# Patient Record
Sex: Female | Born: 1960 | Race: Black or African American | Hispanic: No | Marital: Single | Smoking: Current every day smoker
Health system: Southern US, Community
[De-identification: ages and names within clinical notes are randomized; demographics above are authoritative.]

## PROBLEM LIST (undated history)

## (undated) ENCOUNTER — Emergency Department (HOSPITAL_COMMUNITY): Disposition: A | Discharge status: Home / Self Care | Payer: Self-pay

## (undated) DIAGNOSIS — E785 Hyperlipidemia, unspecified: Secondary | ICD-10-CM

## (undated) DIAGNOSIS — I10 Essential (primary) hypertension: Secondary | ICD-10-CM

---

## 2008-03-26 ENCOUNTER — Emergency Department (HOSPITAL_COMMUNITY): Admission: EM | Admit: 2008-03-26 | Discharge: 2008-03-26 | Payer: Self-pay | Admitting: Emergency Medicine

## 2008-04-01 ENCOUNTER — Encounter (INDEPENDENT_AMBULATORY_CARE_PROVIDER_SITE_OTHER): Payer: Self-pay | Admitting: Adult Health

## 2008-04-01 ENCOUNTER — Ambulatory Visit: Payer: Self-pay | Admitting: Internal Medicine

## 2008-04-01 LAB — CONVERTED CEMR LAB
AST: 27 units/L (ref 0–37)
Albumin: 4.2 g/dL (ref 3.5–5.2)
BUN: 11 mg/dL (ref 6–23)
CO2: 20 meq/L (ref 19–32)
Chloride: 96 meq/L (ref 96–112)
Cholesterol: 266 mg/dL — ABNORMAL HIGH (ref 0–200)
Glucose, Bld: 464 mg/dL — ABNORMAL HIGH (ref 70–99)
HDL: 42 mg/dL (ref >39)
LDL Cholesterol: 172 mg/dL — ABNORMAL HIGH (ref 0–99)
Microalb, Ur: 0.63 mg/dL (ref 0.00–1.89)
Potassium: 4.3 meq/L (ref 3.5–5.3)
Sodium: 134 meq/L — ABNORMAL LOW (ref 135–145)
Triglycerides: 261 mg/dL — ABNORMAL HIGH (ref <150)
VLDL: 52 mg/dL — ABNORMAL HIGH (ref 0–40)

## 2008-04-02 ENCOUNTER — Ambulatory Visit: Payer: Self-pay | Admitting: Internal Medicine

## 2008-04-05 ENCOUNTER — Ambulatory Visit: Payer: Self-pay | Admitting: Internal Medicine

## 2008-04-05 ENCOUNTER — Encounter (INDEPENDENT_AMBULATORY_CARE_PROVIDER_SITE_OTHER): Payer: Self-pay | Admitting: Adult Health

## 2008-04-25 ENCOUNTER — Ambulatory Visit: Payer: Self-pay | Admitting: *Deleted

## 2008-05-06 ENCOUNTER — Ambulatory Visit: Payer: Self-pay | Admitting: Internal Medicine

## 2008-05-08 ENCOUNTER — Ambulatory Visit (HOSPITAL_COMMUNITY): Admission: RE | Admit: 2008-05-08 | Discharge: 2008-05-08 | Payer: Self-pay | Admitting: Internal Medicine

## 2008-05-14 ENCOUNTER — Ambulatory Visit: Payer: Self-pay | Admitting: Internal Medicine

## 2008-05-22 ENCOUNTER — Emergency Department (HOSPITAL_COMMUNITY): Admission: EM | Admit: 2008-05-22 | Discharge: 2008-05-22 | Payer: Self-pay | Admitting: Family Medicine

## 2008-06-03 ENCOUNTER — Ambulatory Visit: Payer: Self-pay | Admitting: Internal Medicine

## 2008-07-22 ENCOUNTER — Ambulatory Visit: Payer: Self-pay | Admitting: Internal Medicine

## 2008-07-25 ENCOUNTER — Ambulatory Visit: Payer: Self-pay | Admitting: Internal Medicine

## 2008-07-29 ENCOUNTER — Ambulatory Visit: Payer: Self-pay | Admitting: Internal Medicine

## 2008-07-30 ENCOUNTER — Emergency Department (HOSPITAL_COMMUNITY): Admission: EM | Admit: 2008-07-30 | Discharge: 2008-07-30 | Payer: Self-pay | Admitting: Emergency Medicine

## 2008-08-14 ENCOUNTER — Encounter: Payer: Self-pay | Admitting: Obstetrics and Gynecology

## 2008-08-14 ENCOUNTER — Ambulatory Visit: Payer: Self-pay | Admitting: Obstetrics & Gynecology

## 2008-08-14 LAB — CONVERTED CEMR LAB: GC Probe Amp, Urine: NEGATIVE

## 2008-09-26 ENCOUNTER — Ambulatory Visit: Payer: Self-pay | Admitting: Internal Medicine

## 2008-09-26 ENCOUNTER — Emergency Department (HOSPITAL_COMMUNITY): Admission: EM | Admit: 2008-09-26 | Discharge: 2008-09-26 | Payer: Self-pay | Admitting: Emergency Medicine

## 2008-09-27 ENCOUNTER — Encounter (INDEPENDENT_AMBULATORY_CARE_PROVIDER_SITE_OTHER): Payer: Self-pay | Admitting: Adult Health

## 2009-04-01 ENCOUNTER — Encounter (INDEPENDENT_AMBULATORY_CARE_PROVIDER_SITE_OTHER): Payer: Self-pay | Admitting: Adult Health

## 2009-04-01 ENCOUNTER — Ambulatory Visit: Payer: Self-pay | Admitting: Internal Medicine

## 2009-06-30 ENCOUNTER — Ambulatory Visit: Payer: Self-pay | Admitting: Family Medicine

## 2009-08-29 ENCOUNTER — Emergency Department (HOSPITAL_COMMUNITY): Admission: EM | Admit: 2009-08-29 | Discharge: 2009-08-29 | Payer: Self-pay | Admitting: Emergency Medicine

## 2009-11-04 ENCOUNTER — Encounter (INDEPENDENT_AMBULATORY_CARE_PROVIDER_SITE_OTHER): Payer: Self-pay | Admitting: Internal Medicine

## 2009-11-04 LAB — CONVERTED CEMR LAB
ALT: 14 units/L (ref 0–35)
Alkaline Phosphatase: 103 units/L (ref 39–117)
Basophils Absolute: 0.1 10*3/uL (ref 0.0–0.1)
Chloride: 106 meq/L (ref 96–112)
Eosinophils Relative: 1 % (ref 0–5)
Glucose, Bld: 144 mg/dL — ABNORMAL HIGH (ref 70–99)
HCT: 41 % (ref 36.0–46.0)
Hemoglobin: 13.5 g/dL (ref 12.0–15.0)
Hgb A1c MFr Bld: 14.1 % — ABNORMAL HIGH (ref <5.7)
Lipase: 58 units/L (ref 0–75)
MCHC: 32.9 g/dL (ref 30.0–36.0)
MCV: 84.9 fL (ref 78.0–100.0)
Monocytes Relative: 5 % (ref 3–12)
Neutro Abs: 4.3 10*3/uL (ref 1.7–7.7)
Neutrophils Relative %: 45 % (ref 43–77)
Platelets: 272 10*3/uL (ref 150–400)
Sodium: 141 meq/L (ref 135–145)

## 2010-01-29 ENCOUNTER — Emergency Department (HOSPITAL_COMMUNITY)
Admission: EM | Admit: 2010-01-29 | Discharge: 2010-01-29 | Payer: Self-pay | Source: Home / Self Care | Admitting: Emergency Medicine

## 2010-02-26 ENCOUNTER — Emergency Department (HOSPITAL_COMMUNITY)
Admission: EM | Admit: 2010-02-26 | Discharge: 2010-02-26 | Payer: Self-pay | Source: Home / Self Care | Admitting: Emergency Medicine

## 2010-03-01 ENCOUNTER — Encounter: Payer: Self-pay | Admitting: Internal Medicine

## 2010-03-02 ENCOUNTER — Encounter: Payer: Self-pay | Admitting: *Deleted

## 2010-03-02 LAB — COMPREHENSIVE METABOLIC PANEL
ALT: 16 U/L (ref 0–35)
AST: 15 U/L (ref 0–37)
Albumin: 3.9 g/dL (ref 3.5–5.2)
Alkaline Phosphatase: 111 U/L (ref 39–117)
CO2: 23 mEq/L (ref 19–32)
Calcium: 9.7 mg/dL (ref 8.4–10.5)
Glucose, Bld: 345 mg/dL — ABNORMAL HIGH (ref 70–99)
Potassium: 4.2 mEq/L (ref 3.5–5.1)
Total Protein: 7.6 g/dL (ref 6.0–8.3)

## 2010-03-02 LAB — URINALYSIS, ROUTINE W REFLEX MICROSCOPIC
Bilirubin Urine: NEGATIVE
Ketones, ur: 15 mg/dL — AB
Protein, ur: NEGATIVE mg/dL
Specific Gravity, Urine: 1.025 (ref 1.005–1.030)
Urine Glucose, Fasting: >1000 mg/dL — AB
pH: 5.5 (ref 5.0–8.0)

## 2010-03-02 LAB — CBC
MCH: 27.7 pg (ref 26.0–34.0)
MCHC: 33.3 g/dL (ref 30.0–36.0)
Platelets: 284 10*3/uL (ref 150–400)
RDW: 13.1 % (ref 11.5–15.5)
WBC: 6.6 10*3/uL (ref 4.0–10.5)

## 2010-03-02 LAB — GLUCOSE, CAPILLARY: Glucose-Capillary: 198 mg/dL — ABNORMAL HIGH (ref 70–99)

## 2010-03-02 LAB — URINE MICROSCOPIC-ADD ON

## 2010-03-02 LAB — DIFFERENTIAL
Basophils Absolute: 0 10*3/uL (ref 0.0–0.1)
Eosinophils Relative: 1 % (ref 0–5)
Lymphocytes Relative: 42 % (ref 12–46)
Lymphs Abs: 2.7 10*3/uL (ref 0.7–4.0)
Monocytes Absolute: 0.3 10*3/uL (ref 0.1–1.0)
Neutrophils Relative %: 52 % (ref 43–77)

## 2010-03-02 LAB — LIPASE, BLOOD: Lipase: 21 U/L (ref 11–59)

## 2010-04-20 LAB — CBC
HCT: 40.8 % (ref 36.0–46.0)
MCH: 28.2 pg (ref 26.0–34.0)
MCV: 83.4 fL (ref 78.0–100.0)
RBC: 4.89 MIL/uL (ref 3.87–5.11)
RDW: 13.3 % (ref 11.5–15.5)
WBC: 6.7 10*3/uL (ref 4.0–10.5)

## 2010-04-20 LAB — COMPREHENSIVE METABOLIC PANEL
Albumin: 3.6 g/dL (ref 3.5–5.2)
BUN: 14 mg/dL (ref 6–23)
CO2: 26 mEq/L (ref 19–32)
Calcium: 10 mg/dL (ref 8.4–10.5)
Chloride: 110 mEq/L (ref 96–112)
Creatinine, Ser: 0.69 mg/dL (ref 0.4–1.2)
Total Protein: 7 g/dL (ref 6.0–8.3)

## 2010-04-20 LAB — DIFFERENTIAL
Basophils Relative: 0 % (ref 0–1)
Eosinophils Absolute: 0 10*3/uL (ref 0.0–0.7)
Eosinophils Relative: 0 % (ref 0–5)
Lymphs Abs: 2.8 10*3/uL (ref 0.7–4.0)
Monocytes Relative: 5 % (ref 3–12)

## 2010-04-25 LAB — POCT I-STAT, CHEM 8
Calcium, Ion: 1.33 mmol/L — ABNORMAL HIGH (ref 1.12–1.32)
Creatinine, Ser: 0.8 mg/dL (ref 0.4–1.2)
Glucose, Bld: 378 mg/dL — ABNORMAL HIGH (ref 70–99)
Hemoglobin: 17 g/dL — ABNORMAL HIGH (ref 12.0–15.0)
Potassium: 4 mEq/L (ref 3.5–5.1)
TCO2: 24 mmol/L (ref 0–100)

## 2010-05-18 LAB — BASIC METABOLIC PANEL
Calcium: 10.3 mg/dL (ref 8.4–10.5)
Creatinine, Ser: 0.73 mg/dL (ref 0.4–1.2)
GFR calc Af Amer: >60 mL/min (ref >60)

## 2010-05-18 LAB — CBC
Hemoglobin: 16.1 g/dL — ABNORMAL HIGH (ref 12.0–15.0)
MCHC: 34.5 g/dL (ref 30.0–36.0)
MCV: 82.9 fL (ref 78.0–100.0)
RBC: 5.63 MIL/uL — ABNORMAL HIGH (ref 3.87–5.11)

## 2010-05-18 LAB — URINALYSIS, ROUTINE W REFLEX MICROSCOPIC
Hgb urine dipstick: NEGATIVE
Specific Gravity, Urine: 1.043 — ABNORMAL HIGH (ref 1.005–1.030)
Urobilinogen, UA: 0.2 mg/dL (ref 0.0–1.0)
pH: 5 (ref 5.0–8.0)

## 2010-05-18 LAB — POCT I-STAT 3, VENOUS BLOOD GAS (G3P V)
Acid-base deficit: 1 mmol/L (ref 0.0–2.0)
O2 Saturation: 77 %
Patient temperature: 97.7

## 2010-05-18 LAB — DIFFERENTIAL
Basophils Relative: 0 % (ref 0–1)
Eosinophils Absolute: 0.1 10*3/uL (ref 0.0–0.7)
Monocytes Absolute: 0.3 10*3/uL (ref 0.1–1.0)
Monocytes Relative: 4 % (ref 3–12)

## 2010-05-18 LAB — URINE MICROSCOPIC-ADD ON

## 2010-05-18 LAB — GLUCOSE, CAPILLARY
Glucose-Capillary: 244 mg/dL — ABNORMAL HIGH (ref 70–99)
Glucose-Capillary: 419 mg/dL — ABNORMAL HIGH (ref 70–99)

## 2010-05-26 LAB — GLUCOSE, CAPILLARY
Glucose-Capillary: 231 mg/dL — ABNORMAL HIGH (ref 70–99)
Glucose-Capillary: 334 mg/dL — ABNORMAL HIGH (ref 70–99)
Glucose-Capillary: >600 mg/dL (ref 70–99)
Glucose-Capillary: >600 mg/dL (ref 70–99)

## 2010-05-26 LAB — URINALYSIS, ROUTINE W REFLEX MICROSCOPIC
Bilirubin Urine: NEGATIVE
Hgb urine dipstick: NEGATIVE
Protein, ur: NEGATIVE mg/dL
Specific Gravity, Urine: 1.039 — ABNORMAL HIGH (ref 1.005–1.030)
Urobilinogen, UA: 0.2 mg/dL (ref 0.0–1.0)

## 2010-05-26 LAB — DIFFERENTIAL
Basophils Absolute: 0.1 10*3/uL (ref 0.0–0.1)
Basophils Relative: 1 % (ref 0–1)
Eosinophils Absolute: 0 10*3/uL (ref 0.0–0.7)
Lymphs Abs: 2.6 10*3/uL (ref 0.7–4.0)
Neutrophils Relative %: 64 % (ref 43–77)

## 2010-05-26 LAB — COMPREHENSIVE METABOLIC PANEL
ALT: 33 U/L (ref 0–35)
Alkaline Phosphatase: 135 U/L — ABNORMAL HIGH (ref 39–117)
CO2: 24 mEq/L (ref 19–32)
Calcium: 10 mg/dL (ref 8.4–10.5)
Chloride: 96 mEq/L (ref 96–112)
GFR calc non Af Amer: >60 mL/min (ref >60)
Glucose, Bld: 596 mg/dL (ref 70–99)
Sodium: 131 mEq/L — ABNORMAL LOW (ref 135–145)
Total Bilirubin: 1.1 mg/dL (ref 0.3–1.2)

## 2010-05-26 LAB — CBC
Hemoglobin: 13.9 g/dL (ref 12.0–15.0)
MCHC: 33.5 g/dL (ref 30.0–36.0)
RBC: 4.97 MIL/uL (ref 3.87–5.11)

## 2010-06-23 NOTE — Group Therapy Note (Signed)
April Mcgee, April Mcgee             ACCOUNT NO.:  [id number]   MEDICAL RECORD NO.:  [id number]          PATIENT TYPE:  WOC   LOCATION:  [facility]                   FACILITY:  [facility]   PHYSICIAN:  Caren Griffins, CNM       DATE OF BIRTH:  03-05-1960   DATE OF SERVICE:  08/14/2008                                  CLINIC NOTE   REASON FOR VISIT:  Left abdominal pain.   HISTORY:  This is the initial visit here for a 50 year old G1 P1 who is  several years postmenopausal and is referred by [facility] for  abdominal pain.  She states that she has had left upper quadrant and  left lower quadrant abdominal pain present since October.  She describes  it as a soreness now.  When it began it was more crampy and sharp but it  continues to be constant, not related to eating activity, bowel  movement, voiding.  She denies any antecedent injury or strain.  She  denies constipation and has bowel movement daily.  She has tried  Naprosyn and Tramadol for the pain without any effect.  She denies any  vaginal discharge or irritation and has not been sexually active for  about 4 or 5 years.  She has been in prison for the past few years and  this pain began while she was still in prison in October.  She had a Pap  smear at [facility] in 2009 and is not yet due for one.  States it was  normal.   ALLERGIES:  None.   MEDICATIONS:  Please see list.   IMMUNIZATIONS:  She has had the usual childhood immunizations.   OBSTETRICAL HISTORY:  As above.  Her child is 2 years old.   GYNECOLOGIC HISTORY:  She states she has never had an abnormal Pap, has  not had a mammogram and has had no surgeries.   FAMILY HISTORY:  Significant for diabetes, heart disease, hypertension.   PERSONAL MEDICAL HISTORY:  Significant for:  1. Hypertension.  2. Diabetes.  3. Hypercholesterolemia.  4. Arthritis.  5. Kidney infections.   SOCIAL HISTORY:  Lives with her brother for the past several months  since she has been  out of prison.  She smokes, states one and a half  cigarettes a day.  She drinks occasional caffeine, but does not admit to  any alcohol or illicit drug use.   REVIEW OF SYSTEMS:  Positive for chronic dry cough, muscle aches, weight  loss and weight gain, some stress urinary incontinence and occasional  hot flashes that do not interfere with usual activities.   Please refer to CT scan that was done through [facility] May 08, 2008.  It was essentially normal, possible constipation.  There was a  note of a 2.5 cm left uterine body fibroid.   PHYSICAL EXAM:  Temperature 97.5, BP 120/80, weight 203.  African American obese female in no acute distress.  Abbreviated  physical exam.  ABDOMEN:  Obese, essentially nontender.  There is moderate tenderness  left suprapubic region.  No guarding or rebound.  PELVIC EXAM:  NEFG.  Vagina pink  with decreased rugae.  Cervix  posterior, parous, clean.  There is no CMT.  Unable to evaluate size of  uterus due to body habitus, and unable to appreciate adnexal masses.  No  tenderness from the vaginal manipulation.  However, there is left  suprapubic tenderness during bimanual assessment.   ASSESSMENT:  Consulted Dr. Marice Potter.  Decided to do a urinalysis for GC and  Chlamydia and also schedule for a pelvic ultrasound to rule out any  significant pathology that may have anything to do with her pain.  Meanwhile, she is to continue on her Naprosyn as directed.  She is  advised on weight loss and smoking cessation.  She is also advised that  she should schedule a mammogram for baseline.  At next visit will also  need a breast exam.  She is unable to stay for completion of exam with  breast exam today as she is stressed and ready to go.  We will complete  this at next visit when she comes for results of her pelvic ultrasound.           ______________________________  Caren Griffins, CNM     DP/MEDQ  D:  08/14/2008  T:  08/14/2008  Job:  [id number]

## 2010-11-22 ENCOUNTER — Emergency Department (HOSPITAL_COMMUNITY)
Admission: EM | Admit: 2010-11-22 | Discharge: 2010-11-22 | Disposition: A | Discharge status: Home / Self Care | Payer: Self-pay | Attending: Emergency Medicine | Admitting: Emergency Medicine

## 2010-11-22 DIAGNOSIS — R51 Headache: Secondary | ICD-10-CM | POA: Insufficient documentation

## 2010-11-22 DIAGNOSIS — R0982 Postnasal drip: Secondary | ICD-10-CM | POA: Insufficient documentation

## 2010-11-22 DIAGNOSIS — R059 Cough, unspecified: Secondary | ICD-10-CM | POA: Insufficient documentation

## 2010-11-22 DIAGNOSIS — K219 Gastro-esophageal reflux disease without esophagitis: Secondary | ICD-10-CM | POA: Insufficient documentation

## 2010-11-22 DIAGNOSIS — J40 Bronchitis, not specified as acute or chronic: Secondary | ICD-10-CM | POA: Insufficient documentation

## 2010-11-22 DIAGNOSIS — E119 Type 2 diabetes mellitus without complications: Secondary | ICD-10-CM | POA: Insufficient documentation

## 2010-11-22 DIAGNOSIS — I10 Essential (primary) hypertension: Secondary | ICD-10-CM | POA: Insufficient documentation

## 2010-11-22 DIAGNOSIS — E785 Hyperlipidemia, unspecified: Secondary | ICD-10-CM | POA: Insufficient documentation

## 2010-11-22 DIAGNOSIS — J3489 Other specified disorders of nose and nasal sinuses: Secondary | ICD-10-CM | POA: Insufficient documentation

## 2010-11-22 DIAGNOSIS — R05 Cough: Secondary | ICD-10-CM | POA: Insufficient documentation

## 2010-11-22 LAB — GLUCOSE, CAPILLARY: Glucose-Capillary: 160 mg/dL — ABNORMAL HIGH (ref 70–99)

## 2011-05-08 ENCOUNTER — Encounter (HOSPITAL_COMMUNITY): Payer: Self-pay | Admitting: Emergency Medicine

## 2011-05-08 ENCOUNTER — Emergency Department (HOSPITAL_COMMUNITY)
Admission: EM | Admit: 2011-05-08 | Discharge: 2011-05-08 | Disposition: A | Discharge status: Home / Self Care | Payer: Self-pay | Attending: Emergency Medicine | Admitting: Emergency Medicine

## 2011-05-08 ENCOUNTER — Emergency Department (HOSPITAL_COMMUNITY): Payer: Self-pay

## 2011-05-08 DIAGNOSIS — R109 Unspecified abdominal pain: Secondary | ICD-10-CM | POA: Insufficient documentation

## 2011-05-08 DIAGNOSIS — I10 Essential (primary) hypertension: Secondary | ICD-10-CM | POA: Insufficient documentation

## 2011-05-08 DIAGNOSIS — E119 Type 2 diabetes mellitus without complications: Secondary | ICD-10-CM | POA: Insufficient documentation

## 2011-05-08 HISTORY — DX: Essential (primary) hypertension: I10

## 2011-05-08 LAB — CBC
MCV: 83.3 fL (ref 78.0–100.0)
Platelets: 231 10*3/uL (ref 150–400)
RDW: 13.5 % (ref 11.5–15.5)
WBC: 8 10*3/uL (ref 4.0–10.5)

## 2011-05-08 LAB — URINALYSIS, ROUTINE W REFLEX MICROSCOPIC
Bilirubin Urine: NEGATIVE
Glucose, UA: NEGATIVE mg/dL
Hgb urine dipstick: NEGATIVE
Protein, ur: NEGATIVE mg/dL
Urobilinogen, UA: 0.2 mg/dL (ref 0.0–1.0)

## 2011-05-08 LAB — DIFFERENTIAL
Basophils Absolute: 0 10*3/uL (ref 0.0–0.1)
Eosinophils Relative: 1 % (ref 0–5)
Lymphocytes Relative: 47 % — ABNORMAL HIGH (ref 12–46)
Neutro Abs: 3.8 10*3/uL (ref 1.7–7.7)
Neutrophils Relative %: 47 % (ref 43–77)

## 2011-05-08 LAB — BASIC METABOLIC PANEL
CO2: 23 mEq/L (ref 19–32)
Calcium: 10.2 mg/dL (ref 8.4–10.5)
Sodium: 141 mEq/L (ref 135–145)

## 2011-05-08 LAB — PREGNANCY, URINE: Preg Test, Ur: NEGATIVE

## 2011-05-08 MED ORDER — ACETAMINOPHEN-CODEINE #3 300-30 MG PO TABS
1.0000 | ORAL_TABLET | Freq: Once | ORAL | Status: AC
  Administered 2011-05-08: 1 via ORAL
  Filled 2011-05-08: qty 1

## 2011-05-08 MED ORDER — ONDANSETRON HCL 4 MG PO TABS: 4.0000 mg | ORAL_TABLET | Freq: Four times a day (QID) | ORAL | Status: AC

## 2011-05-08 MED ORDER — ONDANSETRON 4 MG PO TBDP
4.0000 mg | ORAL_TABLET | Freq: Once | ORAL | Status: AC
  Administered 2011-05-08: 4 mg via ORAL
  Filled 2011-05-08: qty 1

## 2011-05-08 MED ORDER — ACETAMINOPHEN-CODEINE #3 300-30 MG PO TABS: 1.0000 | ORAL_TABLET | Freq: Four times a day (QID) | ORAL | Status: AC | PRN

## 2011-05-08 NOTE — ED Provider Notes (Signed)
History     CSN: [id number]  Arrival date & time 05/08/11  6213   First MD Initiated Contact with Patient 05/08/11 0800      Chief Complaint  Patient presents with  . Abdominal Pain    (Consider location/radiation/quality/duration/timing/severity/associated sxs/prior treatment) HPI  Patient presents to emergency department complaining of a greater than three-year history of intermittent left-sided abdominal pain. Patient denies any history of abdominal surgeries. Patient states that she'll have "soreness in the left side of my stomach on and off for years." Patient states that over the last month the pain is increased. Patient states the pain is daily but intermittent throughout the day. Patient denies any associated fevers, chills, chest pain, shortness of breath, cough, nausea, vomiting, diarrhea, hematuria, dysuria, blood in her stool, or vaginal discharge. Patient states the pain is in the left mid aspect of her abdomen. Patient states pain is "sore to touch." Patient states she's taken an occasional pain pill that she's gotten from a neighbor with some relief of pain stating "Tylenol and ibuprofen just do not cut it." [facility] and states she has complained about this current issue to them but states "they really haven't done anything just that I may need an x-ray at some point in time."  Past Medical History  Diagnosis Date  . Diabetes mellitus   . Hypertension     History reviewed. No pertinent past surgical history.  History reviewed. No pertinent family history.  History  Substance Use Topics  . Smoking status: Current Everyday Smoker -- 0.5 packs/day  . Smokeless tobacco: Not on file  . Alcohol Use: No    OB History    Grav Para Term Preterm Abortions TAB SAB Ect Mult Living                  Review of Systems  All other systems reviewed and are negative.    Allergies  Review of patient's allergies indicates no known  allergies.  Home Medications   Current Outpatient Rx  Name Route Sig Dispense Refill  . INSULIN ASPART 100 UNIT/ML [place] SOLN Subcutaneous Inject 24 Units into the skin 3 (three) times daily before meals.    . INSULIN GLARGINE 100 UNIT/ML [place] SOLN Subcutaneous Inject 80 Units into the skin at bedtime.      BP 129/70  Pulse 68  Temp(Src) 97.8 F (36.6 C) (Oral)  Resp 20  SpO2 100%  Physical Exam  Nursing note and vitals reviewed. Constitutional: She is oriented to person, place, and time. She appears well-developed and well-nourished. No distress.  HENT:  Head: Normocephalic and atraumatic.  Eyes: Conjunctivae are normal.  Neck: Normal range of motion. Neck supple.  Cardiovascular: Normal rate, regular rhythm, normal heart sounds and intact distal pulses.  Exam reveals no gallop and no friction rub.   No murmur heard. Pulmonary/Chest: Effort normal and breath sounds normal. No respiratory distress. She has no wheezes. She has no rales. She exhibits no tenderness.  Abdominal: Soft. Bowel sounds are normal. She exhibits no distension and no mass. There is no tenderness. There is no rebound and no guarding.       Mild TTP of left mid lateral abdomen but no peritoneal signs. No rebound  Musculoskeletal: Normal range of motion. She exhibits no edema and no tenderness.  Neurological: She is alert and oriented to person, place, and time.  Skin: Skin is warm and dry. No rash noted. She is not diaphoretic. No erythema.  Psychiatric:  She has a normal mood and affect.    ED Course  Procedures (including critical care time)  PO tylenol #3 and zofran  Labs Reviewed  DIFFERENTIAL - Abnormal; Notable for the following:    Lymphocytes Relative 47 (*)    All other components within normal limits  BASIC METABOLIC PANEL - Abnormal; Notable for the following:    Potassium 3.4 (*)    Glucose, Bld 126 (*)    All other components within normal limits  URINALYSIS, ROUTINE W REFLEX MICROSCOPIC   CBC  PREGNANCY, URINE   Dg Abd Acute W/chest  05/08/2011  *RADIOLOGY REPORT*  Clinical Data: Abdominal pain.  ACUTE ABDOMEN SERIES (ABDOMEN 2 VIEW & CHEST 1 VIEW)  Comparison: 01/29/2010  Findings: Coarse linear scarring or atelectasis in the lung bases as before.  Heart size upper limits normal.  No effusion.  No free air.  Small bowel decompressed.  Fecal material in the proximal colon, decompressed distally.  Regional bones unremarkable.  IMPRESSION:  1.  Nonobstructive bowel gas pattern.  No free air. 2.  No acute cardiopulmonary disease.  Original Report Authenticated By: Thora Lance III, M.D.     1. Abdominal pain   2. Diabetes mellitus       MDM  Patient is afebrile and nontoxic-appearing. She's had abdominal pain x3 years with no signs or symptoms of acute abdomen at this time with only mild tenderness to palpation of left mid abdomen but no rigidity or peritoneal signs. No hx of abdominal surgeries. No acute findings on labs or urine. Patient is agreeable to following up with primary care physician for further evaluation and management of what appears to be chronic abdominal pain. Spoke at length about precautions that should prompt return to the emergency department. Patient voices her understanding and is agreeable to plan.        Lenon Oms [place], [place] 05/08/11 [phone]

## 2011-05-08 NOTE — ED Notes (Signed)
Patient states she is having left side abdominal pain x 3 years. [facility] advised patient to come to ED for xrays. Patient experiencing nausea and denies vomiting, fever chest pain. Patient states her abdomen is swollen and tender to touch.

## 2011-05-08 NOTE — Discharge Instructions (Signed)
Take Tylenol #3 for pain using Zofran for nausea and you may alternate with ibuprofen use. It is very important to followup with your primary care physician for further evaluation and management of abdominal pain especially seeing there has been similar pain or the last 3 years. However if something were to emergently change or worsen, return to emergency department for further evaluation and management. You may need over-the-counter stool softener with Tylenol #3 use.  Abdominal Pain Abdominal pain can be caused by many things. Your caregiver decides the seriousness of your pain by an examination and possibly blood tests and X-rays. Many cases can be observed and treated at home. Most abdominal pain is not caused by a disease and will probably improve without treatment. However, in many cases, more time must pass before a clear cause of the pain can be found. Before that point, it may not be known if you need more testing, or if hospitalization or surgery is needed. HOME CARE INSTRUCTIONS   Do not take laxatives unless directed by your caregiver.   Take pain medicine only as directed by your caregiver.   Only take over-the-counter or prescription medicines for pain, discomfort, or fever as directed by your caregiver.   Try a clear liquid diet (broth, tea, or water) for as long as directed by your caregiver. Slowly move to a bland diet as tolerated.  SEEK IMMEDIATE MEDICAL CARE IF:   The pain does not go away.   You have a fever.   You keep throwing up (vomiting).   The pain is felt only in portions of the abdomen. Pain in the right side could possibly be appendicitis. In an adult, pain in the left lower portion of the abdomen could be colitis or diverticulitis.   You pass bloody or black tarry stools.  MAKE SURE YOU:   Understand these instructions.   Will watch your condition.   Will get help right away if you are not doing well or get worse.  Document Released: 11/04/2004 Document  Revised: 01/14/2011 Document Reviewed: 09/13/2007 [facility] Patient Information 2012 [place], [place].

## 2011-05-08 NOTE — ED Notes (Signed)
Patient states that she has had abdominal for three years, but that the pain has increasingly gotten worse over the last week; patient states that she has been referred here to receive an abdominal x-ray.  Patient reports most abdominal pain in left upper quadrant.  Reports nausea, denies vomiting and diarrhea.  Last bowel movement yesterday; normal.  Patient feels that her whole stomach feels "tight" and that her abdomen is more distended than usual.

## 2011-05-09 NOTE — ED Provider Notes (Signed)
Medical screening examination/treatment/procedure(s) were performed by non-physician practitioner and as supervising physician I was immediately available for consultation/collaboration.  Trianna Lupien T Irasema Chalk, MD 05/09/11 1534 

## 2011-10-11 ENCOUNTER — Encounter (HOSPITAL_COMMUNITY): Payer: Self-pay | Admitting: Family Medicine

## 2011-10-11 ENCOUNTER — Emergency Department (HOSPITAL_COMMUNITY)
Admission: EM | Admit: 2011-10-11 | Discharge: 2011-10-11 | Disposition: A | Discharge status: Home / Self Care | Payer: No Typology Code available for payment source | Attending: Emergency Medicine | Admitting: Emergency Medicine

## 2011-10-11 DIAGNOSIS — M542 Cervicalgia: Secondary | ICD-10-CM | POA: Insufficient documentation

## 2011-10-11 DIAGNOSIS — F17200 Nicotine dependence, unspecified, uncomplicated: Secondary | ICD-10-CM | POA: Insufficient documentation

## 2011-10-11 DIAGNOSIS — I10 Essential (primary) hypertension: Secondary | ICD-10-CM | POA: Insufficient documentation

## 2011-10-11 DIAGNOSIS — Z043 Encounter for examination and observation following other accident: Secondary | ICD-10-CM | POA: Insufficient documentation

## 2011-10-11 DIAGNOSIS — Z79899 Other long term (current) drug therapy: Secondary | ICD-10-CM | POA: Insufficient documentation

## 2011-10-11 DIAGNOSIS — M62838 Other muscle spasm: Secondary | ICD-10-CM | POA: Insufficient documentation

## 2011-10-11 DIAGNOSIS — E119 Type 2 diabetes mellitus without complications: Secondary | ICD-10-CM | POA: Insufficient documentation

## 2011-10-11 DIAGNOSIS — Z794 Long term (current) use of insulin: Secondary | ICD-10-CM | POA: Insufficient documentation

## 2011-10-11 MED ORDER — CYCLOBENZAPRINE HCL 5 MG PO TABS
7.5000 mg | ORAL_TABLET | Freq: Once | ORAL | Status: DC
  Filled 2011-10-11: qty 1.5

## 2011-10-11 MED ORDER — IBUPROFEN 800 MG PO TABS: 800.0000 mg | ORAL_TABLET | Freq: Three times a day (TID) | ORAL | Status: AC

## 2011-10-11 MED ORDER — CYCLOBENZAPRINE HCL 10 MG PO TABS
10.0000 mg | ORAL_TABLET | Freq: Once | ORAL | Status: AC
  Administered 2011-10-11: 10 mg via ORAL
  Filled 2011-10-11: qty 1

## 2011-10-11 MED ORDER — CYCLOBENZAPRINE HCL 10 MG PO TABS: 10.0000 mg | ORAL_TABLET | Freq: Two times a day (BID) | ORAL | Status: AC | PRN
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
## 2011-10-11 NOTE — ED Provider Notes (Signed)
Medical screening examination/treatment/procedure(s) were performed by non-physician practitioner and as supervising physician I was immediately available for consultation/collaboration.  Dhruv Christina, MD 10/11/11 1802 

## 2011-12-17 ENCOUNTER — Emergency Department (HOSPITAL_COMMUNITY): Payer: Self-pay

## 2011-12-17 ENCOUNTER — Emergency Department (HOSPITAL_COMMUNITY)
Admission: EM | Admit: 2011-12-17 | Discharge: 2011-12-17 | Disposition: A | Discharge status: Home / Self Care | Payer: Self-pay | Attending: Emergency Medicine | Admitting: Emergency Medicine

## 2011-12-17 ENCOUNTER — Encounter (HOSPITAL_COMMUNITY): Payer: Self-pay | Admitting: Emergency Medicine

## 2011-12-17 DIAGNOSIS — F17200 Nicotine dependence, unspecified, uncomplicated: Secondary | ICD-10-CM | POA: Insufficient documentation

## 2011-12-17 DIAGNOSIS — E119 Type 2 diabetes mellitus without complications: Secondary | ICD-10-CM | POA: Insufficient documentation

## 2011-12-17 DIAGNOSIS — Z79899 Other long term (current) drug therapy: Secondary | ICD-10-CM | POA: Insufficient documentation

## 2011-12-17 DIAGNOSIS — R110 Nausea: Secondary | ICD-10-CM | POA: Insufficient documentation

## 2011-12-17 DIAGNOSIS — J40 Bronchitis, not specified as acute or chronic: Secondary | ICD-10-CM | POA: Insufficient documentation

## 2011-12-17 DIAGNOSIS — E162 Hypoglycemia, unspecified: Secondary | ICD-10-CM | POA: Insufficient documentation

## 2011-12-17 DIAGNOSIS — Z794 Long term (current) use of insulin: Secondary | ICD-10-CM | POA: Insufficient documentation

## 2011-12-17 DIAGNOSIS — I10 Essential (primary) hypertension: Secondary | ICD-10-CM | POA: Insufficient documentation

## 2011-12-17 LAB — POCT I-STAT, CHEM 8
BUN: 12 mg/dL (ref 6–23)
Chloride: 108 mEq/L (ref 96–112)
Creatinine, Ser: 0.9 mg/dL (ref 0.50–1.10)
Potassium: 3.3 mEq/L — ABNORMAL LOW (ref 3.5–5.1)
Sodium: 143 mEq/L (ref 135–145)

## 2011-12-17 LAB — CBC WITH DIFFERENTIAL/PLATELET
Basophils Relative: 0 % (ref 0–1)
Eosinophils Relative: 0 % (ref 0–5)
HCT: 39.1 % (ref 36.0–46.0)
Lymphs Abs: 2 10*3/uL (ref 0.7–4.0)
MCV: 83.5 fL (ref 78.0–100.0)
Monocytes Relative: 6 % (ref 3–12)
Neutro Abs: 4.3 10*3/uL (ref 1.7–7.7)
RBC: 4.68 MIL/uL (ref 3.87–5.11)
RDW: 14.1 % (ref 11.5–15.5)
WBC: 6.7 10*3/uL (ref 4.0–10.5)

## 2011-12-17 LAB — GLUCOSE, CAPILLARY: Glucose-Capillary: 33 mg/dL — CL (ref 70–99)

## 2011-12-17 MED ORDER — ONDANSETRON HCL 4 MG/2ML IJ SOLN
4.0000 mg | Freq: Once | INTRAMUSCULAR | Status: AC
  Administered 2011-12-17: 4 mg via INTRAVENOUS
  Filled 2011-12-17: qty 2

## 2011-12-17 MED ORDER — AZITHROMYCIN 250 MG PO TABS
500.0000 mg | ORAL_TABLET | Freq: Once | ORAL | Status: AC
  Administered 2011-12-17: 500 mg via ORAL
  Filled 2011-12-17: qty 2

## 2011-12-17 MED ORDER — AZITHROMYCIN 250 MG PO TABS: 250.0000 mg | ORAL_TABLET | Freq: Every day | ORAL | Status: DC

## 2011-12-17 MED ORDER — DEXTROSE 50 % IV SOLN
INTRAVENOUS | Status: AC
  Administered 2011-12-17: 15:00:00
  Filled 2011-12-17: qty 50

## 2011-12-17 MED ORDER — SODIUM CHLORIDE 0.9 % IV BOLUS (SEPSIS)
1000.0000 mL | Freq: Once | INTRAVENOUS | Status: AC
  Administered 2011-12-17: 1000 mL via INTRAVENOUS

## 2011-12-17 MED ORDER — ONDANSETRON HCL 4 MG PO TABS: 4.0000 mg | ORAL_TABLET | Freq: Four times a day (QID) | ORAL | Status: AC

## 2011-12-17 NOTE — ED Provider Notes (Signed)
History     CSN: [id number]  Arrival date & time 12/17/11  1449   First MD Initiated Contact with Patient 12/17/11 1543      Chief Complaint  Patient presents with  . URI  . Nausea     HPI Patient presents with a one week long history of upper respiratory infection with productive sputum.  No fever or chills.  Has been taking extra insulin at home.  Today she felt weak and presented to the emergency department at [facility] with a initial blood glucose of 33.  She was given an amp of D50 and 30 minutes later the blood glucose measured 174.  She was eating crackers and peanut butter when I entered the room. Past Medical History  Diagnosis Date  . Diabetes mellitus   . Hypertension     History reviewed. No pertinent past surgical history.  History reviewed. No pertinent family history.  History  Substance Use Topics  . Smoking status: Current Every Day Smoker -- 0.5 packs/day  . Smokeless tobacco: Not on file  . Alcohol Use: No    OB History    Grav Para Term Preterm Abortions TAB SAB Ect Mult Living                  Review of Systems All other systems reviewed and are negative Allergies  Review of patient's allergies indicates no known allergies.  Home Medications   Current Outpatient Rx  Name  Route  Sig  Dispense  Refill  . DM-GUAIFENESIN ER 30-600 MG PO TB12   Oral   Take 1 tablet by mouth every 12 (twelve) hours.         . INSULIN ASPART 100 UNIT/ML [place] SOLN   Subcutaneous   Inject 20 Units into the skin See admin instructions.          . INSULIN GLARGINE 100 UNIT/ML  SOLN   Subcutaneous   Inject 80 Units into the skin at bedtime.         . AZITHROMYCIN 250 MG PO TABS   Oral   Take 1 tablet (250 mg total) by mouth daily.   4 tablet   0     BP 130/84  Pulse 74  Temp 97.7 F (36.5 C) (Oral)  Resp 18  SpO2 98%  Physical Exam  Nursing note and vitals reviewed. Constitutional: She is oriented to person, place, and time. She appears  well-developed and well-nourished. No distress.  HENT:  Head: Normocephalic and atraumatic.  Eyes: Pupils are equal, round, and reactive to light.  Neck: Normal range of motion.  Cardiovascular: Normal rate and intact distal pulses.   Pulmonary/Chest: No respiratory distress.  Abdominal: Normal appearance. She exhibits no distension. There is no tenderness. There is no rebound and no guarding.  Musculoskeletal: Normal range of motion.  Neurological: She is alert and oriented to person, place, and time. No cranial nerve deficit.  Skin: Skin is warm and dry. No rash noted.  Psychiatric: She has a normal mood and affect. Her behavior is normal.    ED Course  Procedures (including critical care time)  Medications  dextromethorphan-guaiFENesin (MUCINEX DM) 30-600 MG per 12 hr tablet (not administered)  azithromycin (ZITHROMAX Z-PAK) 250 MG tablet (not administered)  azithromycin (ZITHROMAX) tablet 500 mg (not administered)  dextrose 50 % solution (   Given 12/17/11 1525)  ondansetron (ZOFRAN) injection 4 mg (4 mg Intravenous Given 12/17/11 1615)  sodium chloride 0.9 % bolus 1,000 mL (1000 mL Intravenous  New Bag/Given 12/17/11 1615)    Labs Reviewed  GLUCOSE, CAPILLARY - Abnormal; Notable for the following:    Glucose-Capillary 33 (*)     All other components within normal limits  GLUCOSE, CAPILLARY - Abnormal; Notable for the following:    Glucose-Capillary 174 (*)     All other components within normal limits  POCT I-STAT, CHEM 8 - Abnormal; Notable for the following:    Potassium 3.3 (*)     All other components within normal limits  CBC WITH DIFFERENTIAL   Dg Chest 2 View  12/17/2011  *RADIOLOGY REPORT*  Clinical Data: Cold symptoms  CHEST - 2 VIEW  Comparison: 07/30/2008  Findings: Lung volumes.  Lungs are essentially clear. No pleural effusion or pneumothorax.  Cardiomediastinal silhouette is within normal limits.  Mild degenerative changes of the visualized thoracolumbar spine.   IMPRESSION: No evidence of acute cardiopulmonary disease.   Original Report Authenticated By: Charline Bills, M.D.      1. Bronchitis   2. Hypoglycemia       MDM         Nelia Shi, MD 12/17/11 1739

## 2011-12-17 NOTE — ED Notes (Signed)
Patient states takes insulin and took her dose in am and only at and one egg today.

## 2011-12-17 NOTE — ED Notes (Signed)
Pt sts URI with productive cough x several weeks and nausea and body aches

## 2013-07-10 ENCOUNTER — Other Ambulatory Visit (HOSPITAL_COMMUNITY): Payer: Self-pay | Admitting: Nurse Practitioner

## 2013-07-10 ENCOUNTER — Ambulatory Visit (HOSPITAL_COMMUNITY)
Admission: RE | Admit: 2013-07-10 | Discharge: 2013-07-10 | Disposition: A | Discharge status: Home / Self Care | Payer: Self-pay | Source: Ambulatory Visit | Attending: Nurse Practitioner | Admitting: Nurse Practitioner

## 2013-07-10 DIAGNOSIS — R0989 Other specified symptoms and signs involving the circulatory and respiratory systems: Secondary | ICD-10-CM | POA: Insufficient documentation

## 2013-07-10 DIAGNOSIS — F17200 Nicotine dependence, unspecified, uncomplicated: Secondary | ICD-10-CM

## 2013-07-10 DIAGNOSIS — R059 Cough, unspecified: Secondary | ICD-10-CM | POA: Insufficient documentation

## 2013-07-10 DIAGNOSIS — R05 Cough: Secondary | ICD-10-CM | POA: Insufficient documentation

## 2013-07-10 DIAGNOSIS — R0609 Other forms of dyspnea: Secondary | ICD-10-CM | POA: Insufficient documentation

## 2013-07-10 DIAGNOSIS — I10 Essential (primary) hypertension: Secondary | ICD-10-CM | POA: Insufficient documentation

## 2014-02-22 ENCOUNTER — Emergency Department (HOSPITAL_COMMUNITY)
Admission: EM | Admit: 2014-02-22 | Discharge: 2014-02-22 | Disposition: A | Discharge status: Home / Self Care | Payer: Self-pay | Attending: Emergency Medicine | Admitting: Emergency Medicine

## 2014-02-22 ENCOUNTER — Emergency Department (HOSPITAL_COMMUNITY): Payer: Self-pay

## 2014-02-22 ENCOUNTER — Encounter (HOSPITAL_COMMUNITY): Payer: Self-pay | Admitting: Emergency Medicine

## 2014-02-22 DIAGNOSIS — E119 Type 2 diabetes mellitus without complications: Secondary | ICD-10-CM | POA: Insufficient documentation

## 2014-02-22 DIAGNOSIS — R1012 Left upper quadrant pain: Secondary | ICD-10-CM | POA: Insufficient documentation

## 2014-02-22 DIAGNOSIS — Z79899 Other long term (current) drug therapy: Secondary | ICD-10-CM | POA: Insufficient documentation

## 2014-02-22 DIAGNOSIS — R630 Anorexia: Secondary | ICD-10-CM | POA: Insufficient documentation

## 2014-02-22 DIAGNOSIS — K5900 Constipation, unspecified: Secondary | ICD-10-CM | POA: Insufficient documentation

## 2014-02-22 DIAGNOSIS — R1110 Vomiting, unspecified: Secondary | ICD-10-CM

## 2014-02-22 DIAGNOSIS — Z792 Long term (current) use of antibiotics: Secondary | ICD-10-CM | POA: Insufficient documentation

## 2014-02-22 DIAGNOSIS — Z720 Tobacco use: Secondary | ICD-10-CM | POA: Insufficient documentation

## 2014-02-22 DIAGNOSIS — I10 Essential (primary) hypertension: Secondary | ICD-10-CM | POA: Insufficient documentation

## 2014-02-22 DIAGNOSIS — Z794 Long term (current) use of insulin: Secondary | ICD-10-CM | POA: Insufficient documentation

## 2014-02-22 DIAGNOSIS — R112 Nausea with vomiting, unspecified: Secondary | ICD-10-CM | POA: Insufficient documentation

## 2014-02-22 HISTORY — DX: Hyperlipidemia, unspecified: E78.5

## 2014-02-22 LAB — URINALYSIS, ROUTINE W REFLEX MICROSCOPIC
Bilirubin Urine: NEGATIVE
HGB URINE DIPSTICK: NEGATIVE
Ketones, ur: NEGATIVE mg/dL
Leukocytes, UA: NEGATIVE
Nitrite: NEGATIVE
Protein, ur: NEGATIVE mg/dL
Specific Gravity, Urine: 1.024 (ref 1.005–1.030)
Urobilinogen, UA: 0.2 mg/dL (ref 0.0–1.0)
pH: 5 (ref 5.0–8.0)

## 2014-02-22 LAB — I-STAT TROPONIN, ED: TROPONIN I, POC: 0 ng/mL (ref 0.00–0.08)

## 2014-02-22 LAB — CBC WITH DIFFERENTIAL/PLATELET
BASOS ABS: 0 10*3/uL (ref 0.0–0.1)
Basophils Relative: 0 % (ref 0–1)
EOS ABS: 0.1 10*3/uL (ref 0.0–0.7)
Eosinophils Relative: 1 % (ref 0–5)
HCT: 38.1 % (ref 36.0–46.0)
Hemoglobin: 12.8 g/dL (ref 12.0–15.0)
Lymphocytes Relative: 29 % (ref 12–46)
Lymphs Abs: 2.6 10*3/uL (ref 0.7–4.0)
MCH: 27.7 pg (ref 26.0–34.0)
MCHC: 33.6 g/dL (ref 30.0–36.0)
MCV: 82.5 fL (ref 78.0–100.0)
Monocytes Absolute: 0.3 10*3/uL (ref 0.1–1.0)
Monocytes Relative: 4 % (ref 3–12)
NEUTROS PCT: 66 % (ref 43–77)
Neutro Abs: 5.8 10*3/uL (ref 1.7–7.7)
PLATELETS: 270 10*3/uL (ref 150–400)
RBC: 4.62 MIL/uL (ref 3.87–5.11)
RDW: 14.2 % (ref 11.5–15.5)
WBC: 8.8 10*3/uL (ref 4.0–10.5)

## 2014-02-22 LAB — COMPREHENSIVE METABOLIC PANEL
ALBUMIN: 3.6 g/dL (ref 3.5–5.2)
ALK PHOS: 106 U/L (ref 39–117)
ALT: 25 U/L (ref 0–35)
AST: 19 U/L (ref 0–37)
Anion gap: 7 (ref 5–15)
BUN: 13 mg/dL (ref 6–23)
CALCIUM: 9.9 mg/dL (ref 8.4–10.5)
CO2: 23 mmol/L (ref 19–32)
Chloride: 108 mEq/L (ref 96–112)
Creatinine, Ser: 0.72 mg/dL (ref 0.50–1.10)
GFR calc Af Amer: >90 mL/min (ref >90)
GFR calc non Af Amer: >90 mL/min (ref >90)
GLUCOSE: 245 mg/dL — AB (ref 70–99)
POTASSIUM: 4 mmol/L (ref 3.5–5.1)
SODIUM: 138 mmol/L (ref 135–145)
Total Bilirubin: 0.3 mg/dL (ref 0.3–1.2)
Total Protein: 7 g/dL (ref 6.0–8.3)

## 2014-02-22 LAB — URINE MICROSCOPIC-ADD ON

## 2014-02-22 LAB — LIPASE, BLOOD: Lipase: 21 U/L (ref 11–59)

## 2014-02-22 MED ORDER — PROMETHAZINE HCL 25 MG/ML IJ SOLN
12.5000 mg | INTRAMUSCULAR | Status: AC
  Administered 2014-02-22: 12.5 mg via INTRAVENOUS
  Filled 2014-02-22: qty 1

## 2014-02-22 MED ORDER — SODIUM CHLORIDE 0.9 % IV BOLUS (SEPSIS)
1000.0000 mL | Freq: Once | INTRAVENOUS | Status: AC
  Administered 2014-02-22: 1000 mL via INTRAVENOUS

## 2014-02-22 MED ORDER — PROMETHAZINE HCL 25 MG PO TABS: 25.0000 mg | ORAL_TABLET | Freq: Four times a day (QID) | ORAL | Status: AC | PRN

## 2014-02-22 MED ORDER — ONDANSETRON 4 MG PO TBDP
8.0000 mg | ORAL_TABLET | Freq: Once | ORAL | Status: AC
  Administered 2014-02-22: 8 mg via ORAL
  Filled 2014-02-22: qty 2

## 2014-02-22 NOTE — ED Notes (Signed)
The patient has been having abdominal pain, nausea and vomiting.  She said she has vomited twice in the last 20minutes.    She said it started at about 2000hrs, she started feeling like she had to go to the bathroom so she took some ex-lax.  She said she took the ex-lax because she felt like she needed to poop and could not.  She rates her pain 9/10.

## 2014-02-22 NOTE — ED Provider Notes (Signed)
CSN: [id number]     Arrival date & time 02/22/14  8341 History   First MD Initiated Contact with Patient 02/22/14 [phone]     Chief Complaint  Patient presents with  . Abdominal Pain    The patient has been having abdominal pain, nausea and vomiting.  She said she has vomited twice in the last .    . Nausea  . Emesis     (Consider location/radiation/quality/duration/timing/severity/associated sxs/prior Treatment) Patient is a 54 y.o. female presenting with abdominal pain and vomiting. The history is provided by the patient.  Abdominal Pain Associated symptoms: constipation, nausea and vomiting   Associated symptoms: no chest pain, no diarrhea and no shortness of breath   Emesis Associated symptoms: abdominal pain   Associated symptoms: no diarrhea and no headaches    patient presents with abdominal pain. Left-sided. Also has nausea and vomiting. The pain is dull. She states she feels constipated but is still passing gas. She took a laxative and has had increased pain. No fevers. She states she has a decreased appetite. No sick contacts. No dysuria. She also has a dull headache. The pain in her abdomen began last night. No significant abdominal history.  Past Medical History  Diagnosis Date  . Diabetes mellitus   . Hypertension   . Hyperlipidemia    No past surgical history on file. No family history on file. History  Substance Use Topics  . Smoking status: Current Every Day Smoker -- 0.50 packs/day  . Smokeless tobacco: Not on file  . Alcohol Use: No   OB History    No data available     Review of Systems  Constitutional: Positive for appetite change. Negative for activity change.  Eyes: Negative for pain.  Respiratory: Negative for chest tightness and shortness of breath.   Cardiovascular: Negative for chest pain and leg swelling.  Gastrointestinal: Positive for nausea, vomiting, abdominal pain and constipation. Negative for diarrhea.  Genitourinary: Negative for  flank pain.  Musculoskeletal: Negative for back pain and neck stiffness.  Skin: Negative for rash.  Neurological: Negative for weakness, numbness and headaches.  Psychiatric/Behavioral: Negative for behavioral problems.      Allergies  Review of patient's allergies indicates no known allergies.  Home Medications   Prior to Admission medications   Medication Sig Start Date End Date Taking? Authorizing Provider  acetaminophen (TYLENOL) 650 MG CR tablet Take 650 mg by mouth every 8 (eight) hours as needed for pain.   Yes Historical Provider, MD  amLODipine (NORVASC) 10 MG tablet Take 10 mg by mouth daily.   Yes Historical Provider, MD  ibuprofen (ADVIL,MOTRIN) 600 MG tablet Take 600 mg by mouth every 12 (twelve) hours as needed for mild pain or moderate pain.   Yes Historical Provider, MD  insulin aspart protamine- aspart (NOVOLOG MIX 70/30) (70-30) 100 UNIT/ML injection Inject 30-50 Units into the skin 2 (two) times daily with a meal. Patient takes 30 units in the morning and 50 units in the evening   Yes Historical Provider, MD  ondansetron (ZOFRAN) 4 MG tablet Take 1 tablet (4 mg total) by mouth every 6 (six) hours. 12/17/11  Yes Nelia Shi, MD  OVER THE COUNTER MEDICATION Take 1 tablet by mouth every 8 (eight) hours as needed (Leg Cramps). Homeopathic "Leg Cramps" taken 2 to 3 times daily   Yes Historical Provider, MD  pantoprazole (PROTONIX) 40 MG tablet Take 40 mg by mouth daily.   Yes Historical Provider, MD  azithromycin (ZITHROMAX Z-PAK) 250 MG  tablet Take 1 tablet (250 mg total) by mouth daily. Patient not taking: Reported on 02/22/2014 12/17/11   Nelia Shiobert L Beaton, MD  promethazine (PHENERGAN) 25 MG tablet Take 1 tablet (25 mg total) by mouth every 6 (six) hours as needed for nausea. 02/22/14   Juliet RudeNathan R. Meghanne Pletz, MD   BP 136/81 mmHg  Pulse 63  Temp(Src) 98.2 F (36.8 C) (Oral)  Resp 18  SpO2 99% Physical Exam  Constitutional: She is oriented to person, place, and time. She  appears well-developed and well-nourished.  HENT:  Head: Normocephalic and atraumatic.  Eyes: EOM are normal. Pupils are equal, round, and reactive to light.  Neck: Normal range of motion. Neck supple.  Cardiovascular: Normal rate, regular rhythm and normal heart sounds.   No murmur heard. Pulmonary/Chest: Effort normal and breath sounds normal. No respiratory distress. She has no wheezes. She has no rales.  Abdominal: Soft. She exhibits distension. There is tenderness. There is no rebound and no guarding.  Mild left upper quadrant left-sided tenderness without rebound or guarding. No hernias palpated in groin or umbilical area.  Musculoskeletal: Normal range of motion.  Neurological: She is alert and oriented to person, place, and time. No cranial nerve deficit.  Skin: Skin is warm and dry.  Psychiatric: She has a normal mood and affect. Her speech is normal.  Nursing note and vitals reviewed.   ED Course  Procedures (including critical care time) Labs Review Labs Reviewed  COMPREHENSIVE METABOLIC PANEL - Abnormal; Notable for the following:    Glucose, Bld 245 (*)    All other components within normal limits  URINALYSIS, ROUTINE W REFLEX MICROSCOPIC - Abnormal; Notable for the following:    Glucose, UA >1000 (*)    All other components within normal limits  URINE MICROSCOPIC-ADD ON - Abnormal; Notable for the following:    Bacteria, UA FEW (*)    All other components within normal limits  CBC WITH DIFFERENTIAL  LIPASE, BLOOD  I-STAT TROPOININ, ED    Imaging Review No results found.   EKG Interpretation None      MDM   Final diagnoses:  Vomiting  Left upper quadrant pain    Vomiting. LUQ abdominal pain. Labs reassuring. Feels better after treatment. Will d/c    Juliet RudeNathan R. Rubin PayorPickering, MD 02/25/14 1319

## 2014-02-22 NOTE — ED Notes (Signed)
Pt aware of urine sample needed. Pt unable to go at this time. 

## 2014-02-22 NOTE — Discharge Instructions (Signed)
Abdominal Pain °Many things can cause abdominal pain. Usually, abdominal pain is not caused by a disease and will improve without treatment. It can often be observed and treated at home. Your health care provider will do a physical exam and possibly order blood tests and X-rays to help determine the seriousness of your pain. However, in many cases, more time must pass before a clear cause of the pain can be found. Before that point, your health care provider may not know if you need more testing or further treatment. °HOME CARE INSTRUCTIONS  °Monitor your abdominal pain for any changes. The following actions may help to alleviate any discomfort you are experiencing: °· Only take over-the-counter or prescription medicines as directed by your health care provider. °· Do not take laxatives unless directed to do so by your health care provider. °· Try a clear liquid diet (broth, tea, or water) as directed by your health care provider. Slowly move to a bland diet as tolerated. °SEEK MEDICAL CARE IF: °· You have unexplained abdominal pain. °· You have abdominal pain associated with nausea or diarrhea. °· You have pain when you urinate or have a bowel movement. °· You experience abdominal pain that wakes you in the night. °· You have abdominal pain that is worsened or improved by eating food. °· You have abdominal pain that is worsened with eating fatty foods. °· You have a fever. °SEEK IMMEDIATE MEDICAL CARE IF:  °· Your pain does not go away within 2 hours. °· You keep throwing up (vomiting). °· Your pain is felt only in portions of the abdomen, such as the right side or the left lower portion of the abdomen. °· You pass bloody or black tarry stools. °MAKE SURE YOU: °· Understand these instructions.   °· Will watch your condition.   °· Will get help right away if you are not doing well or get worse.   °Document Released: 11/04/2004 Document Revised: 01/30/2013 Document Reviewed: 10/04/2012 °[employer]® Patient Information  ©2015 [employer]. This information is not intended to replace advice given to you by your health care provider. Make sure you discuss any questions you have with your health care provider. ° °Nausea and Vomiting °Nausea is a sick feeling that often comes before throwing up (vomiting). Vomiting is a reflex where stomach contents come out of your mouth. Vomiting can cause severe loss of body fluids (dehydration). Children and elderly adults can become dehydrated quickly, especially if they also have diarrhea. Nausea and vomiting are symptoms of a condition or disease. It is important to find the cause of your symptoms. °CAUSES  °· Direct irritation of the stomach lining. This irritation can result from increased acid production (gastroesophageal reflux disease), infection, food poisoning, taking certain medicines (such as nonsteroidal anti-inflammatory drugs), alcohol use, or tobacco use. °· Signals from the brain. These signals could be caused by a headache, heat exposure, an inner ear disturbance, increased pressure in the brain from injury, infection, a tumor, or a concussion, pain, emotional stimulus, or metabolic problems. °· An obstruction in the gastrointestinal tract (bowel obstruction). °· Illnesses such as diabetes, hepatitis, gallbladder problems, appendicitis, kidney problems, cancer, sepsis, atypical symptoms of a heart attack, or eating disorders. °· Medical treatments such as chemotherapy and radiation. °· Receiving medicine that makes you sleep (general anesthetic) during surgery. °DIAGNOSIS °Your caregiver may ask for tests to be done if the problems do not improve after a few days. Tests may also be done if symptoms are severe or if the reason for the nausea   and vomiting is not clear. Tests may include: °· Urine tests. °· Blood tests. °· Stool tests. °· Cultures (to look for evidence of infection). °· X-rays or other imaging studies. °Test results can help your caregiver make decisions about  treatment or the need for additional tests. °TREATMENT °You need to stay well hydrated. Drink frequently but in small amounts. You may wish to drink water, sports drinks, clear broth, or eat frozen ice pops or gelatin dessert to help stay hydrated. When you eat, eating slowly may help prevent nausea. There are also some antinausea medicines that may help prevent nausea. °HOME CARE INSTRUCTIONS  °· Take all medicine as directed by your caregiver. °· If you do not have an appetite, do not force yourself to eat. However, you must continue to drink fluids. °· If you have an appetite, eat a normal diet unless your caregiver tells you differently. °¨ Eat a variety of complex carbohydrates (rice, wheat, potatoes, bread), lean meats, yogurt, fruits, and vegetables. °¨ Avoid high-fat foods because they are more difficult to digest. °· Drink enough water and fluids to keep your urine clear or pale yellow. °· If you are dehydrated, ask your caregiver for specific rehydration instructions. Signs of dehydration may include: °¨ Severe thirst. °¨ Dry lips and mouth. °¨ Dizziness. °¨ Dark urine. °¨ Decreasing urine frequency and amount. °¨ Confusion. °¨ Rapid breathing or pulse. °SEEK IMMEDIATE MEDICAL CARE IF:  °· You have blood or brown flecks (like coffee grounds) in your vomit. °· You have black or bloody stools. °· You have a severe headache or stiff neck. °· You are confused. °· You have severe abdominal pain. °· You have chest pain or trouble breathing. °· You do not urinate at least once every 8 hours. °· You develop cold or clammy skin. °· You continue to vomit for longer than 24 to 48 hours. °· You have a fever. °MAKE SURE YOU:  °· Understand these instructions. °· Will watch your condition. °· Will get help right away if you are not doing well or get worse. °Document Released: 01/25/2005 Document Revised: 04/19/2011 Document Reviewed: 06/24/2010 °[employer]® Patient Information ©2015 [employer]. This information is not  intended to replace advice given to you by your health care provider. Make sure you discuss any questions you have with your health care provider. ° °

## 2015-11-25 ENCOUNTER — Other Ambulatory Visit (HOSPITAL_COMMUNITY): Payer: Self-pay | Admitting: Primary Care

## 2015-11-25 DIAGNOSIS — R140 Abdominal distension (gaseous): Secondary | ICD-10-CM

## 2015-11-26 ENCOUNTER — Ambulatory Visit (HOSPITAL_COMMUNITY)
Admission: RE | Admit: 2015-11-26 | Discharge: 2015-11-26 | Disposition: A | Discharge status: Home / Self Care | Payer: Self-pay | Source: Ambulatory Visit | Attending: Primary Care | Admitting: Primary Care

## 2015-11-27 ENCOUNTER — Encounter (INDEPENDENT_AMBULATORY_CARE_PROVIDER_SITE_OTHER): Payer: Self-pay

## 2015-11-27 ENCOUNTER — Other Ambulatory Visit: Payer: Self-pay | Admitting: Obstetrics and Gynecology

## 2015-11-27 ENCOUNTER — Ambulatory Visit (HOSPITAL_COMMUNITY)
Admission: RE | Admit: 2015-11-27 | Discharge: 2015-11-27 | Disposition: A | Discharge status: Home / Self Care | Payer: Medicaid Other | Source: Ambulatory Visit | Attending: Primary Care | Admitting: Primary Care

## 2015-11-27 DIAGNOSIS — R140 Abdominal distension (gaseous): Secondary | ICD-10-CM

## 2015-11-27 DIAGNOSIS — R1012 Left upper quadrant pain: Secondary | ICD-10-CM | POA: Diagnosis present

## 2015-11-27 DIAGNOSIS — J9811 Atelectasis: Secondary | ICD-10-CM | POA: Diagnosis not present

## 2015-11-27 MED ORDER — IOPAMIDOL (ISOVUE-300) INJECTION 61%
100.0000 mL | Freq: Once | INTRAVENOUS | Status: AC | PRN
  Administered 2015-11-27: 100 mL via INTRAVENOUS

## 2015-12-20 ENCOUNTER — Encounter (HOSPITAL_COMMUNITY): Payer: Self-pay | Admitting: Emergency Medicine

## 2015-12-20 ENCOUNTER — Emergency Department (HOSPITAL_COMMUNITY): Payer: Medicaid Other

## 2015-12-20 ENCOUNTER — Emergency Department (HOSPITAL_COMMUNITY)
Admission: EM | Admit: 2015-12-20 | Discharge: 2015-12-20 | Disposition: A | Discharge status: Home / Self Care | Payer: Medicaid Other | Attending: Emergency Medicine | Admitting: Emergency Medicine

## 2015-12-20 DIAGNOSIS — Y939 Activity, unspecified: Secondary | ICD-10-CM | POA: Diagnosis not present

## 2015-12-20 DIAGNOSIS — F17200 Nicotine dependence, unspecified, uncomplicated: Secondary | ICD-10-CM | POA: Diagnosis not present

## 2015-12-20 DIAGNOSIS — I10 Essential (primary) hypertension: Secondary | ICD-10-CM | POA: Diagnosis not present

## 2015-12-20 DIAGNOSIS — Z794 Long term (current) use of insulin: Secondary | ICD-10-CM | POA: Diagnosis not present

## 2015-12-20 DIAGNOSIS — Y92410 Unspecified street and highway as the place of occurrence of the external cause: Secondary | ICD-10-CM | POA: Diagnosis not present

## 2015-12-20 DIAGNOSIS — Y999 Unspecified external cause status: Secondary | ICD-10-CM | POA: Diagnosis not present

## 2015-12-20 DIAGNOSIS — S39012A Strain of muscle, fascia and tendon of lower back, initial encounter: Secondary | ICD-10-CM | POA: Insufficient documentation

## 2015-12-20 DIAGNOSIS — Z79899 Other long term (current) drug therapy: Secondary | ICD-10-CM | POA: Insufficient documentation

## 2015-12-20 DIAGNOSIS — E119 Type 2 diabetes mellitus without complications: Secondary | ICD-10-CM | POA: Diagnosis not present

## 2015-12-20 DIAGNOSIS — S3992XA Unspecified injury of lower back, initial encounter: Secondary | ICD-10-CM | POA: Diagnosis present

## 2015-12-20 MED ORDER — ORPHENADRINE CITRATE ER 100 MG PO TB12: 100.0000 mg | ORAL_TABLET | Freq: Two times a day (BID) | ORAL | 0 refills | Status: DC

## 2015-12-20 MED ORDER — IBUPROFEN 800 MG PO TABS
800.0000 mg | ORAL_TABLET | Freq: Once | ORAL | Status: AC
  Administered 2015-12-20: 800 mg via ORAL
  Filled 2015-12-20: qty 1

## 2015-12-20 MED ORDER — CYCLOBENZAPRINE HCL 10 MG PO TABS
10.0000 mg | ORAL_TABLET | Freq: Once | ORAL | Status: AC
  Administered 2015-12-20: 10 mg via ORAL
  Filled 2015-12-20: qty 1

## 2015-12-20 MED ORDER — NAPROXEN 500 MG PO TABS: 500.0000 mg | ORAL_TABLET | Freq: Two times a day (BID) | ORAL | 0 refills | Status: DC
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
## 2015-12-20 NOTE — Discharge Instructions (Signed)
Apply ice for 20-30 minutes, several times a day.
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
## 2016-02-11 ENCOUNTER — Encounter: Payer: Self-pay | Admitting: Primary Care

## 2018-09-24 ENCOUNTER — Encounter (HOSPITAL_COMMUNITY): Payer: Self-pay | Admitting: Emergency Medicine

## 2018-09-24 ENCOUNTER — Other Ambulatory Visit: Payer: Self-pay

## 2018-09-24 ENCOUNTER — Emergency Department (HOSPITAL_COMMUNITY): Payer: Medicaid Other

## 2018-09-24 ENCOUNTER — Emergency Department (HOSPITAL_COMMUNITY)
Admission: EM | Admit: 2018-09-24 | Discharge: 2018-09-25 | Disposition: A | Discharge status: Home / Self Care | Payer: Medicaid Other | Attending: Emergency Medicine | Admitting: Emergency Medicine

## 2018-09-24 DIAGNOSIS — N3000 Acute cystitis without hematuria: Secondary | ICD-10-CM | POA: Insufficient documentation

## 2018-09-24 DIAGNOSIS — F17200 Nicotine dependence, unspecified, uncomplicated: Secondary | ICD-10-CM | POA: Insufficient documentation

## 2018-09-24 DIAGNOSIS — R404 Transient alteration of awareness: Secondary | ICD-10-CM | POA: Diagnosis not present

## 2018-09-24 DIAGNOSIS — Z794 Long term (current) use of insulin: Secondary | ICD-10-CM | POA: Diagnosis not present

## 2018-09-24 DIAGNOSIS — Z79899 Other long term (current) drug therapy: Secondary | ICD-10-CM | POA: Insufficient documentation

## 2018-09-24 DIAGNOSIS — I10 Essential (primary) hypertension: Secondary | ICD-10-CM | POA: Diagnosis not present

## 2018-09-24 DIAGNOSIS — E119 Type 2 diabetes mellitus without complications: Secondary | ICD-10-CM | POA: Diagnosis not present

## 2018-09-24 DIAGNOSIS — R350 Frequency of micturition: Secondary | ICD-10-CM | POA: Diagnosis present

## 2018-09-24 LAB — TROPONIN I (HIGH SENSITIVITY)
Troponin I (High Sensitivity): 9 ng/L (ref <18)
Troponin I (High Sensitivity): 13 ng/L (ref <18)

## 2018-09-24 LAB — URINALYSIS, ROUTINE W REFLEX MICROSCOPIC
Bilirubin Urine: NEGATIVE
Glucose, UA: >=500 mg/dL — AB
Hgb urine dipstick: NEGATIVE
Ketones, ur: NEGATIVE mg/dL
Nitrite: NEGATIVE
Protein, ur: 100 mg/dL — AB
Specific Gravity, Urine: 1.021 (ref 1.005–1.030)
pH: 5 (ref 5.0–8.0)

## 2018-09-24 LAB — CBC
HCT: 41 % (ref 36.0–46.0)
Hemoglobin: 13.1 g/dL (ref 12.0–15.0)
MCH: 28.6 pg (ref 26.0–34.0)
MCHC: 32 g/dL (ref 30.0–36.0)
MCV: 89.5 fL (ref 80.0–100.0)
Platelets: 236 10*3/uL (ref 150–400)
RBC: 4.58 MIL/uL (ref 3.87–5.11)
RDW: 13.2 % (ref 11.5–15.5)
WBC: 6.4 10*3/uL (ref 4.0–10.5)
nRBC: 0 % (ref 0.0–0.2)

## 2018-09-24 LAB — BASIC METABOLIC PANEL
Anion gap: 9 (ref 5–15)
BUN: 19 mg/dL (ref 6–20)
CO2: 22 mmol/L (ref 22–32)
Calcium: 9.8 mg/dL (ref 8.9–10.3)
Chloride: 105 mmol/L (ref 98–111)
Creatinine, Ser: 1.35 mg/dL — ABNORMAL HIGH (ref 0.44–1.00)
GFR calc Af Amer: 50 mL/min — ABNORMAL LOW (ref >60)
GFR calc non Af Amer: 43 mL/min — ABNORMAL LOW (ref >60)
Glucose, Bld: 300 mg/dL — ABNORMAL HIGH (ref 70–99)
Potassium: 4.3 mmol/L (ref 3.5–5.1)
Sodium: 136 mmol/L (ref 135–145)

## 2018-09-24 LAB — POCT I-STAT EG7
Acid-base deficit: 1 mmol/L (ref 0.0–2.0)
Bicarbonate: 26.2 mmol/L (ref 20.0–28.0)
Calcium, Ion: 1.31 mmol/L (ref 1.15–1.40)
HCT: 37 % (ref 36.0–46.0)
Hemoglobin: 12.6 g/dL (ref 12.0–15.0)
O2 Saturation: 38 %
Potassium: 4 mmol/L (ref 3.5–5.1)
Sodium: 143 mmol/L (ref 135–145)
TCO2: 28 mmol/L (ref 22–32)
pCO2, Ven: 53.9 mmHg (ref 44.0–60.0)
pH, Ven: 7.295 (ref 7.250–7.430)
pO2, Ven: 25 mmHg — CL (ref 32.0–45.0)

## 2018-09-24 LAB — RAPID URINE DRUG SCREEN, HOSP PERFORMED
Amphetamines: NOT DETECTED
Barbiturates: NOT DETECTED
Benzodiazepines: NOT DETECTED
Cocaine: POSITIVE — AB
Opiates: NOT DETECTED
Tetrahydrocannabinol: POSITIVE — AB

## 2018-09-24 LAB — HEPATIC FUNCTION PANEL
ALT: 20 U/L (ref 0–44)
AST: 18 U/L (ref 15–41)
Albumin: 3.7 g/dL (ref 3.5–5.0)
Alkaline Phosphatase: 112 U/L (ref 38–126)
Bilirubin, Direct: <0.1 mg/dL (ref 0.0–0.2)
Total Bilirubin: 0.3 mg/dL (ref 0.3–1.2)
Total Protein: 7.4 g/dL (ref 6.5–8.1)

## 2018-09-24 LAB — AMMONIA: Ammonia: 13 umol/L (ref 9–35)

## 2018-09-24 LAB — I-STAT BETA HCG BLOOD, ED (MC, WL, AP ONLY): I-stat hCG, quantitative: <5 m[IU]/mL (ref <5)

## 2018-09-24 MED ORDER — SODIUM CHLORIDE 0.9 % IV SOLN
1.0000 g | Freq: Once | INTRAVENOUS | Status: AC
  Administered 2018-09-24: 1 g via INTRAVENOUS
  Filled 2018-09-24: qty 10

## 2018-09-24 MED ORDER — SODIUM CHLORIDE 0.9% FLUSH
3.0000 mL | Freq: Once | INTRAVENOUS | Status: AC
  Administered 2018-09-24: 3 mL via INTRAVENOUS

## 2018-09-24 MED ORDER — SODIUM CHLORIDE 0.9 % IV BOLUS
1000.0000 mL | Freq: Once | INTRAVENOUS | Status: AC
  Administered 2018-09-24: 1000 mL via INTRAVENOUS

## 2018-09-24 NOTE — ED Notes (Signed)
Pt ambulated to restroom. 

## 2018-09-24 NOTE — ED Provider Notes (Signed)
[place] EMERGENCY DEPARTMENT Provider Note   CSN: [id number] Arrival date & time: 09/24/18  1549    History   Chief Complaint Chief Complaint  Patient presents with  . Generalized Body Aches  . Chest Pain    HPI April Mcgee is a 58 y.o. female who presents with 3-day history of bilateral lower extremity muscle aches, chest pain, and increased urinary frequency.  Patient states that the pain is a dull achy pain that is in the lower chest.  States that she has been having to urinate so frequently that she has not been able to get any sleep at night.  States that she is very sleepy because of that.  Upon review of patient's lab work her CBG is 300.  States that she has been taking her 70/30 30 units twice daily.  She does not know when her last A1c was.  She has not had seen her PCP in "several years".  Past Medical History:  Diagnosis Date  . Diabetes mellitus   . Hyperlipidemia   . Hypertension     There are no active problems to display for this patient.   History reviewed. No pertinent surgical history.   OB History   No obstetric history on file.      Home Medications    Prior to Admission medications   Medication Sig Start Date End Date Taking? Authorizing Provider  acetaminophen (TYLENOL) 650 MG CR tablet Take 650 mg by mouth every 8 (eight) hours as needed for pain.    [provider]  amLODipine (NORVASC) 10 MG tablet Take 10 mg by mouth daily.    [provider]  ibuprofen (ADVIL,MOTRIN) 600 MG tablet Take 600 mg by mouth every 12 (twelve) hours as needed for mild pain or moderate pain.    [provider]  insulin aspart protamine- aspart (NOVOLOG MIX 70/30) (70-30) 100 UNIT/ML injection Inject 30-50 Units into the skin 2 (two) times daily with a meal. Patient takes 30 units in the morning and 50 units in the evening    [provider]  naproxen (NAPROSYN) 500 MG tablet Take 1 tablet (500 mg total) by  mouth 2 (two) times daily. 78/29/56   Delora Fuel, MD  ondansetron (ZOFRAN) 4 MG tablet Take 1 tablet (4 mg total) by mouth every 6 (six) hours. 12/17/11   Leonard Schwartz, MD  orphenadrine (NORFLEX) 100 MG tablet Take 1 tablet (100 mg total) by mouth 2 (two) times daily. 21/30/86   Delora Fuel, MD  OVER THE COUNTER MEDICATION Take 1 tablet by mouth every 8 (eight) hours as needed (Leg Cramps). Homeopathic "Leg Cramps" taken 2 to 3 times daily    [provider]  pantoprazole (PROTONIX) 40 MG tablet Take 40 mg by mouth daily.    [provider]  promethazine (PHENERGAN) 25 MG tablet Take 1 tablet (25 mg total) by mouth every 6 (six) hours as needed for nausea. 02/22/14   Davonna Belling, MD    Family History No family history on file.  Social History Social History   Tobacco Use  . Smoking status: Current Every Day Smoker    Packs/day: 0.50  . Smokeless tobacco: Never Used  Substance Use Topics  . Alcohol use: No  . Drug use: No     Allergies   Patient has no known allergies.   Review of Systems Review of Systems  Constitutional: Negative for activity change, fatigue and fever.  HENT: Negative for sinus pressure and sinus  pain.   Respiratory: Negative for apnea and chest tightness.   Cardiovascular: Positive for chest pain.  Gastrointestinal: Negative for abdominal distention, diarrhea, nausea and vomiting.  Genitourinary: Positive for frequency and urgency.  Musculoskeletal: Positive for myalgias.     Physical Exam Updated Vital Signs BP 113/72   Pulse (!) 58   Temp 98.6 F (37 C) (Oral)   Resp (!) 23   Ht 5\' 5"  (1.651 m)   Wt 63 kg   SpO2 98%   BMI 23.13 kg/m   Physical Exam Constitutional:      Appearance: She is well-developed.     Comments: Very sleepy during exam  HENT:     Head: Normocephalic and atraumatic.  Eyes:     Extraocular Movements: Extraocular movements intact.     Pupils: Pupils are equal, round, and reactive to light.   Neck:     Musculoskeletal: Normal range of motion.  Cardiovascular:     Rate and Rhythm: Normal rate and regular rhythm.     Heart sounds: Normal heart sounds. No murmur.  Pulmonary:     Effort: Pulmonary effort is normal. No tachypnea or accessory muscle usage.  Abdominal:     General: Bowel sounds are normal.     Palpations: Abdomen is soft.  Musculoskeletal: Normal range of motion.     Right lower leg: No edema.     Left lower leg: No edema.  Lymphadenopathy:     Cervical: No cervical adenopathy.  Skin:    General: Skin is warm.     Coloration: Skin is not cyanotic.  Neurological:     Mental Status: She is alert.      ED Treatments / Results  Labs (all labs ordered are listed, but only abnormal results are displayed) Labs Reviewed  BASIC METABOLIC PANEL - Abnormal; Notable for the following components:      Result Value   Glucose, Bld 300 (*)    Creatinine, Ser 1.35 (*)    GFR calc non Af Amer 43 (*)    GFR calc Af Amer 50 (*)    All other components within normal limits  RAPID URINE DRUG SCREEN, HOSP PERFORMED - Abnormal; Notable for the following components:   Cocaine POSITIVE (*)    Tetrahydrocannabinol POSITIVE (*)    All other components within normal limits  URINALYSIS, ROUTINE W REFLEX MICROSCOPIC - Abnormal; Notable for the following components:   APPearance HAZY (*)    Glucose, UA >=500 (*)    Protein, ur 100 (*)    Leukocytes,Ua MODERATE (*)    Bacteria, UA RARE (*)    All other components within normal limits  POCT I-STAT EG7 - Abnormal; Notable for the following components:   pO2, Ven 25.0 (*)    All other components within normal limits  URINE CULTURE  CBC  AMMONIA  HEPATIC FUNCTION PANEL  ETHANOL  I-STAT BETA HCG BLOOD, ED ([facility], [facility], [facility] ONLY)  I-STAT VENOUS BLOOD GAS, ED  TROPONIN I (HIGH SENSITIVITY)  TROPONIN I (HIGH SENSITIVITY)    EKG EKG Interpretation  Date/Time:  Sunday September 24 2018 18:51:38 EDT Ventricular Rate:  70 PR  Interval:    QRS Duration: 75 QT Interval:  394 QTC Calculation: 426 R Axis:   72 Text Interpretation:  Sinus rhythm Probable left atrial enlargement Confirmed by Blane OharaZavitz, Joshua [phone]([id number]) on 09/24/2018 7:11:26 PM   Radiology Dg Chest 2 View  Result Date: 09/24/2018 CLINICAL DATA:  Generalized body aches for 3 days, central chest pain today.  EXAM: CHEST - 2 VIEW COMPARISON:  Chest x-ray dated 07/10/2013. FINDINGS: The heart size and mediastinal contours are within normal limits. Both lungs are clear. Scoliosis of the thoracolumbar spine. No acute appearing osseous abnormality. IMPRESSION: No active cardiopulmonary disease. No evidence of pneumonia or pulmonary edema. Electronically Signed   By: Bary RichardStan  Maynard M.D.   On: 09/24/2018 18:06   Ct Head Wo Contrast  Result Date: 09/24/2018 CLINICAL DATA:  Initial evaluation for acute altered mental status. EXAM: CT HEAD WITHOUT CONTRAST TECHNIQUE: Contiguous axial images were obtained from the base of the skull through the vertex without intravenous contrast. COMPARISON:  None available. FINDINGS: Brain: Cerebral volume within normal limits for patient age. No evidence for acute intracranial hemorrhage. No findings to suggest acute large vessel territory infarct. No mass lesion, midline shift, or mass effect. Ventricles are normal in size without evidence for hydrocephalus. No extra-axial fluid collection identified. Vascular: No hyperdense vessel identified. Skull: Scalp soft tissues demonstrate no acute abnormality. Calvarium intact. Sinuses/Orbits: Globes and orbital soft tissues within normal limits. Remote posttraumatic defect noted at the right lamina papyracea. Small right maxillary sinus retention cyst. Paranasal sinuses are otherwise largely clear. No mastoid effusion. IMPRESSION: Normal head CT.  No acute intracranial abnormality identified. Electronically Signed   By: Rise MuBenjamin  McClintock M.D.   On: 09/24/2018 21:56    Procedures Procedures  (including critical care time)  Medications Ordered in ED Medications  cefTRIAXone (ROCEPHIN) 1 g in sodium chloride 0.9 % 100 mL IVPB (has no administration in time range)  sodium chloride flush (NS) 0.9 % injection 3 mL (3 mLs Intravenous Given 09/24/18 2154)  sodium chloride 0.9 % bolus 1,000 mL (0 mLs Intravenous Stopped 09/24/18 2037)     Initial Impression / Assessment and Plan / ED Course  I have reviewed the triage vital signs and the nursing notes.  Pertinent labs & imaging results that were available during my care of the patient were reviewed by me and considered in my medical decision making (see chart for details).        58 year old who presents with 3-day history of bilateral extremity muscular cramping pain along with chest pain and increased urinary frequency.  Patient's UA with large amount of glucose, 6-10 squames, 6-10 bacteria, large leukocytes.  Urinary frequency could possibly be due to UTI versus glucosuria.  Of note patient did test positive for cocaine and THC on her UDS.  Has worsening of her creatinine to 1.4 from most recent check of 0.74 years ago.  We will give 1 L fluid bolus.  Reexamined patient continues to be difficult to arouse as trouble staying awake once aroused.  CT head without contrast and VBG.  CT head without any abnormality.  VBG showing O2 sat 25%, otherwise without any abnormality.  Ammonia level normal.  Hepatic function panel without abnormality.  Will add on ethanol level to previous.  Most likely cause of altered mental status secondary to intoxication.  Will give 1 g Rocephin to cover UTI for 24 hours.  Once patient arouses likely finished treatment with 4 days of Keflex.  Case handed off to oncoming ed provider. Likely recheck in 1-2 hours to see if patient's mental status improves. Anticipate d/c home when patient wakes up.  Final Clinical Impressions(s) / ED Diagnoses   Final diagnoses:  Acute cystitis without hematuria  Transient  alteration of awareness    ED Discharge Orders    None    Myrene BuddyJacob Derald Lorge MD PGY-3 Family Medicine Resident  Myrene BuddyFletcher, Azadeh Hyder, MD 09/24/18 [id number]    Blane OharaZavitz, Joshua, MD 09/24/18 [id number]

## 2018-09-24 NOTE — ED Triage Notes (Signed)
Pt to ED with c/o generalized body aches x's 3 days St's today she started having chest pain.  Pt also c/o shortness of breath and nausea but st's she is hungry

## 2018-09-25 LAB — ETHANOL: Alcohol, Ethyl (B): <10 mg/dL (ref <10)

## 2018-09-25 MED ORDER — CEPHALEXIN 500 MG PO CAPS: 500.0000 mg | ORAL_CAPSULE | Freq: Four times a day (QID) | ORAL | 0 refills | Status: DC

## 2018-09-25 NOTE — ED Notes (Signed)
Patient verbalizes understanding of discharge instructions. Opportunity for questioning and answers were provided. Armband removed by staff, pt discharged from ED.  

## 2018-09-25 NOTE — ED Provider Notes (Signed)
12:27 AM Assumed care from Dr. Reather Converse, please see their note for full history, physical and decision making until this point. In brief this is a 58 y.o. year old female who presented to the ED tonight with Generalized Body Aches and Chest Pain     Worked up and found to have UTI. Pending reeval for improved mental status.   Patient is asking directed questions. No slurred speech. Tolerating PO. Ambulates without difficulty. Appears to be stable for discharge. Rx provided for UTI. Suggested close PCP followup.   Discharge instructions, including strict return precautions for new or worsening symptoms, given. Patient and/or family verbalized understanding and agreement with the plan as described.   Labs, studies and imaging reviewed by myself and considered in medical decision making if ordered. Imaging interpreted by radiology.  Labs Reviewed  BASIC METABOLIC PANEL - Abnormal; Notable for the following components:      Result Value   Glucose, Bld 300 (*)    Creatinine, Ser 1.35 (*)    GFR calc non Af Amer 43 (*)    GFR calc Af Amer 50 (*)    All other components within normal limits  RAPID URINE DRUG SCREEN, HOSP PERFORMED - Abnormal; Notable for the following components:   Cocaine POSITIVE (*)    Tetrahydrocannabinol POSITIVE (*)    All other components within normal limits  URINALYSIS, ROUTINE W REFLEX MICROSCOPIC - Abnormal; Notable for the following components:   APPearance HAZY (*)    Glucose, UA >=500 (*)    Protein, ur 100 (*)    Leukocytes,Ua MODERATE (*)    Bacteria, UA RARE (*)    All other components within normal limits  POCT I-STAT EG7 - Abnormal; Notable for the following components:   pO2, Ven 25.0 (*)    All other components within normal limits  URINE CULTURE  CBC  AMMONIA  HEPATIC FUNCTION PANEL  ETHANOL  I-STAT BETA HCG BLOOD, ED ([facility], [facility], [facility] ONLY)  I-STAT VENOUS BLOOD GAS, ED  TROPONIN I (HIGH SENSITIVITY)  TROPONIN I (HIGH SENSITIVITY)    CT Head Wo  Contrast  Final Result    DG Chest 2 View  Final Result      No follow-ups on file.    Thaer Miyoshi, Corene Cornea, MD 09/25/18 [phone]

## 2018-09-26 LAB — URINE CULTURE

## 2019-03-18 ENCOUNTER — Other Ambulatory Visit: Payer: Self-pay

## 2019-03-18 ENCOUNTER — Emergency Department (HOSPITAL_COMMUNITY)
Admission: EM | Admit: 2019-03-18 | Discharge: 2019-03-19 | Disposition: A | Discharge status: Home / Self Care | Payer: Medicaid Other | Attending: Emergency Medicine | Admitting: Emergency Medicine

## 2019-03-18 ENCOUNTER — Emergency Department (HOSPITAL_COMMUNITY): Payer: Medicaid Other

## 2019-03-18 ENCOUNTER — Encounter (HOSPITAL_COMMUNITY): Payer: Self-pay | Admitting: Emergency Medicine

## 2019-03-18 DIAGNOSIS — Z794 Long term (current) use of insulin: Secondary | ICD-10-CM | POA: Insufficient documentation

## 2019-03-18 DIAGNOSIS — R05 Cough: Secondary | ICD-10-CM | POA: Diagnosis present

## 2019-03-18 DIAGNOSIS — U071 COVID-19: Secondary | ICD-10-CM | POA: Diagnosis not present

## 2019-03-18 DIAGNOSIS — E119 Type 2 diabetes mellitus without complications: Secondary | ICD-10-CM | POA: Diagnosis not present

## 2019-03-18 DIAGNOSIS — Z79899 Other long term (current) drug therapy: Secondary | ICD-10-CM | POA: Diagnosis not present

## 2019-03-18 DIAGNOSIS — F17210 Nicotine dependence, cigarettes, uncomplicated: Secondary | ICD-10-CM | POA: Diagnosis not present

## 2019-03-18 DIAGNOSIS — I10 Essential (primary) hypertension: Secondary | ICD-10-CM | POA: Insufficient documentation

## 2019-03-18 LAB — CBC
HCT: 45.7 % (ref 36.0–46.0)
Hemoglobin: 14.5 g/dL (ref 12.0–15.0)
MCH: 28 pg (ref 26.0–34.0)
MCHC: 31.7 g/dL (ref 30.0–36.0)
MCV: 88.2 fL (ref 80.0–100.0)
Platelets: 209 10*3/uL (ref 150–400)
RBC: 5.18 MIL/uL — ABNORMAL HIGH (ref 3.87–5.11)
RDW: 13.8 % (ref 11.5–15.5)
WBC: 3.3 10*3/uL — ABNORMAL LOW (ref 4.0–10.5)
nRBC: 0 % (ref 0.0–0.2)

## 2019-03-18 LAB — BASIC METABOLIC PANEL
Anion gap: 11 (ref 5–15)
BUN: 24 mg/dL — ABNORMAL HIGH (ref 6–20)
CO2: 20 mmol/L — ABNORMAL LOW (ref 22–32)
Calcium: 9.5 mg/dL (ref 8.9–10.3)
Chloride: 108 mmol/L (ref 98–111)
Creatinine, Ser: 1.6 mg/dL — ABNORMAL HIGH (ref 0.44–1.00)
GFR calc Af Amer: 40 mL/min — ABNORMAL LOW (ref >60)
GFR calc non Af Amer: 35 mL/min — ABNORMAL LOW (ref >60)
Glucose, Bld: 125 mg/dL — ABNORMAL HIGH (ref 70–99)
Potassium: 3.9 mmol/L (ref 3.5–5.1)
Sodium: 139 mmol/L (ref 135–145)

## 2019-03-18 LAB — RESPIRATORY PANEL BY RT PCR (FLU A&B, COVID)
Influenza A by PCR: NEGATIVE
Influenza B by PCR: NEGATIVE
SARS Coronavirus 2 by RT PCR: POSITIVE — AB

## 2019-03-18 LAB — TROPONIN I (HIGH SENSITIVITY)
Troponin I (High Sensitivity): 9 ng/L (ref <18)
Troponin I (High Sensitivity): 9 ng/L (ref <18)

## 2019-03-18 MED ORDER — INSULIN ASPART PROT & ASPART (70-30 MIX) 100 UNIT/ML ~~LOC~~ SUSP
30.0000 [IU] | Freq: Every day | SUBCUTANEOUS | Status: DC
  Administered 2019-03-19: 30 [IU] via SUBCUTANEOUS
  Filled 2019-03-18: qty 10

## 2019-03-18 MED ORDER — INSULIN ASPART PROT & ASPART (70-30 MIX) 100 UNIT/ML ~~LOC~~ SUSP
50.0000 [IU] | Freq: Every day | SUBCUTANEOUS | Status: DC
  Filled 2019-03-18: qty 10

## 2019-03-18 MED ORDER — PANTOPRAZOLE SODIUM 40 MG PO TBEC
40.0000 mg | DELAYED_RELEASE_TABLET | Freq: Every day | ORAL | Status: DC
  Administered 2019-03-18 – 2019-03-19 (×2): 40 mg via ORAL
  Filled 2019-03-18 (×2): qty 1

## 2019-03-18 MED ORDER — SODIUM CHLORIDE 0.9 % IV BOLUS
1000.0000 mL | Freq: Once | INTRAVENOUS | Status: AC
  Administered 2019-03-18: 1000 mL via INTRAVENOUS

## 2019-03-18 MED ORDER — SODIUM CHLORIDE 0.9% FLUSH: 3.0000 mL | Freq: Once | INTRAVENOUS | Status: DC

## 2019-03-18 MED ORDER — AMLODIPINE BESYLATE 5 MG PO TABS
10.0000 mg | ORAL_TABLET | Freq: Every day | ORAL | Status: DC
  Administered 2019-03-18 – 2019-03-19 (×2): 10 mg via ORAL
  Filled 2019-03-18 (×2): qty 2

## 2019-03-18 MED ORDER — INSULIN ASPART PROT & ASPART (70-30 MIX) 100 UNIT/ML ~~LOC~~ SUSP
30.0000 [IU] | Freq: Two times a day (BID) | SUBCUTANEOUS | Status: DC
  Filled 2019-03-18: qty 10

## 2019-03-18 MED ORDER — ACETAMINOPHEN 325 MG PO TABS
650.0000 mg | ORAL_TABLET | Freq: Once | ORAL | Status: AC
  Administered 2019-03-18: 650 mg via ORAL
  Filled 2019-03-18: qty 2

## 2019-03-18 MED ORDER — ONDANSETRON HCL 4 MG PO TABS
4.0000 mg | ORAL_TABLET | Freq: Four times a day (QID) | ORAL | Status: DC
  Administered 2019-03-18: 4 mg via ORAL
  Filled 2019-03-18: qty 1

## 2019-03-18 NOTE — ED Provider Notes (Signed)
[facility] EMERGENCY DEPARTMENT Provider Note   CSN: [id number] Arrival date & time: 03/18/19  1428     History Chief Complaint  Patient presents with  . Cough    April Mcgee is a 59 y.o. female hx of DM, HL, HTN, here presenting with shortness of breath, chest pain .  Patient states that she lives in a motel currently. She states that she went to a friend's room and may have been exposed to Covid about a week ago .  She has some chest pain and shortness of breath.  She states that she has no more money so unable to stay in the hotel for this evening .  Patient denies any fevers. Patient states that she has been taking her medicines for diabetes and hypertension. She also requesting medication refill as well.  The history is provided by the patient.       Past Medical History:  Diagnosis Date  . Diabetes mellitus   . Hyperlipidemia   . Hypertension     There are no problems to display for this patient.   History reviewed. No pertinent surgical history.   OB History   No obstetric history on file.     No family history on file.  Social History   Tobacco Use  . Smoking status: Current Every Day Smoker    Packs/day: 0.50  . Smokeless tobacco: Never Used  Substance Use Topics  . Alcohol use: No  . Drug use: No    Home Medications Prior to Admission medications   Medication Sig Start Date End Date Taking? Authorizing Provider  acetaminophen (TYLENOL) 650 MG CR tablet Take 650 mg by mouth every 8 (eight) hours as needed for pain.    [provider]  amLODipine (NORVASC) 10 MG tablet Take 10 mg by mouth daily.    [provider]  cephALEXin (KEFLEX) 500 MG capsule Take 1 capsule (500 mg total) by mouth 4 (four) times daily. 09/25/18   Mesner, Corene Cornea, MD  ibuprofen (ADVIL,MOTRIN) 600 MG tablet Take 600 mg by mouth every 12 (twelve) hours as needed for mild pain or moderate pain.    [provider]  insulin aspart  protamine- aspart (NOVOLOG MIX 70/30) (70-30) 100 UNIT/ML injection Inject 30-50 Units into the skin 2 (two) times daily with a meal. Patient takes 30 units in the morning and 50 units in the evening    [provider]  naproxen (NAPROSYN) 500 MG tablet Take 1 tablet (500 mg total) by mouth 2 (two) times daily. 75/91/63   Delora Fuel, MD  ondansetron (ZOFRAN) 4 MG tablet Take 1 tablet (4 mg total) by mouth every 6 (six) hours. 12/17/11   Leonard Schwartz, MD  orphenadrine (NORFLEX) 100 MG tablet Take 1 tablet (100 mg total) by mouth 2 (two) times daily. 84/66/59   Delora Fuel, MD  OVER THE COUNTER MEDICATION Take 1 tablet by mouth every 8 (eight) hours as needed (Leg Cramps). Homeopathic "Leg Cramps" taken 2 to 3 times daily    [provider]  pantoprazole (PROTONIX) 40 MG tablet Take 40 mg by mouth daily.    [provider]  promethazine (PHENERGAN) 25 MG tablet Take 1 tablet (25 mg total) by mouth every 6 (six) hours as needed for nausea. 02/22/14   Davonna Belling, MD    Allergies    Patient has no known allergies.  Review of Systems   Review of Systems  Respiratory: Positive for cough.   All other systems  reviewed and are negative.   Physical Exam Updated Vital Signs BP (!) 167/101   Pulse 72   Temp 99 F (37.2 C) (Oral)   Resp 18   SpO2 98%   Physical Exam Vitals and nursing note reviewed.  Constitutional:      Comments: Chronically ill   HENT:     Head: Normocephalic.     Nose: Nose normal.     Mouth/Throat:     Mouth: Mucous membranes are dry.  Eyes:     Extraocular Movements: Extraocular movements intact.     Pupils: Pupils are equal, round, and reactive to light.  Cardiovascular:     Rate and Rhythm: Normal rate and regular rhythm.     Pulses: Normal pulses.     Heart sounds: Normal heart sounds.  Pulmonary:     Effort: Pulmonary effort is normal.     Comments: Diminished bilaterally  Abdominal:     General: Abdomen is flat.      Palpations: Abdomen is soft.  Musculoskeletal:        General: Normal range of motion.     Cervical back: Normal range of motion.  Skin:    General: Skin is warm.     Capillary Refill: Capillary refill takes less than 2 seconds.  Neurological:     General: No focal deficit present.     Mental Status: She is alert and oriented to person, place, and time.  Psychiatric:        Mood and Affect: Mood normal.        Behavior: Behavior normal.     ED Results / Procedures / Treatments   Labs (all labs ordered are listed, but only abnormal results are displayed) Labs Reviewed  BASIC METABOLIC PANEL - Abnormal; Notable for the following components:      Result Value   CO2 20 (*)    Glucose, Bld 125 (*)    BUN 24 (*)    Creatinine, Ser 1.60 (*)    GFR calc non Af Amer 35 (*)    GFR calc Af Amer 40 (*)    All other components within normal limits  CBC - Abnormal; Notable for the following components:   WBC 3.3 (*)    RBC 5.18 (*)    All other components within normal limits  RESPIRATORY PANEL BY RT PCR (FLU A&B, COVID)  TROPONIN I (HIGH SENSITIVITY)  TROPONIN I (HIGH SENSITIVITY)    EKG EKG Interpretation  Date/Time:  Sunday March 18 2019 14:32:05 EST Ventricular Rate:  78 PR Interval:  130 QRS Duration: 74 QT Interval:  386 QTC Calculation: 440 R Axis:   48 Text Interpretation: Normal sinus rhythm Biatrial enlargement Septal infarct , age undetermined Abnormal ECG No significant change since last tracing Confirmed by Richardean Canal [phone]) on 03/18/2019 3:30:48 PM   Radiology No results found.  Procedures Procedures (including critical care time)  Medications Ordered in ED Medications  sodium chloride flush (NS) 0.9 % injection 3 mL (has no administration in time range)  sodium chloride 0.9 % bolus 1,000 mL (1,000 mLs Intravenous New Bag/Given 03/18/19 1611)    ED Course  I have reviewed the triage vital signs and the nursing notes.  Pertinent labs & imaging  results that were available during my care of the patient were reviewed by me and considered in my medical decision making (see chart for details).    MDM Rules/Calculators/A&P  April Mcgee is a 59 y.o. female here with SOB, chest pain for several days.  Patient had a possible Covid exposure about a week Ago.  Patient is not hypoxic and is afebrile .  Patient states that she has nowhere to go as well.  We will have social work to see if there is any shelter she can go to.  We will test her for Covid as well.  Will consult case management regarding getting refill for her medications.  7:22 PM Patient is COVID positive. Talked to case management, Burna Mortimer, again.  There is a hotel to house the Covid positive patients .  However, there is none available tonight.  Case management will follow up tomorrow and will find housing for her since she is Covid positive.  She does not meet criteria for admission currently. Restarted on home meds   Final Clinical Impression(s) / ED Diagnoses Final diagnoses:  None    Rx / DC Orders ED Discharge Orders    None       Charlynne Pander, MD 03/18/19 2253

## 2019-03-18 NOTE — Care Management (Signed)
Consulted with Medication assistance however patient has [place] Medicaid coverage which covers prescriptions,updated Dr. Silverio Lay.ED CSW assisting with homeless resources.  Michel Bickers RN, BSN  ED Care Manager [phone]

## 2019-03-18 NOTE — Progress Notes (Signed)
CSW attempted to reach patient via room phone. Room phone continued ringing for two minutes and eventually automatically disconnected prior to patient answering. CSW will attempt to reach patient again to see if there are any family or social supports she could stay with tonight.

## 2019-03-18 NOTE — ED Notes (Signed)
Pt given sandwich bag 

## 2019-03-18 NOTE — Discharge Instructions (Signed)
If you are in need of a shelter please call [facility] ([facility]) at [phone] between the hours of 9am-5pm Mon-Fri.  [facility] will contact all the local shelters to find openings.  Right now they are requiring people to quarantine at a hotel before they can go to an open bed but [facility] may be able to set that up as well.  They are not open on weekends.  On Monday-Friday morning at 8 am until 1 pm, you can also go to the [facility] (see below) to seek shelter in the Hotel Quarantine Program prior to entering a shelter. You can also call the number provided (see the above paragraph) to seek placement into the program by calling [facility].   [facility] [address] [place], [place] [address] Phone: [phone] Fax: [phone]  For Free Breakfasts and Lunches 7 days a week you can go to: [facility] [address], [place], [place] [address] Hours: Open today  8AM-5PM Phone: [phone] Breakfast: 6:30-7:30 am Lunch served: 10:40 am - 12:40pm         [facility] M-F 8am-3pm   [address] [place], [place] [address]   [phone] Services include: laundry, barbering, support groups, case management, phone  & computer access, showers, AA/NA mtgs, mental health/substance abuse nurse, job skills class, disability information, VA assistance, spiritual classes, etc.   [facility] [address] [place], [place]   [phone] Provides breakfast each weekday morning except Wednesdays, and an evening community meal every Friday. Access to showers is available during breakfast hours and telephones for seeking work are also provided. Also offers job referral and counseling for the homeless and unemployed.  [facility]OM[facility] SHELTERS [facility]   [facility] [address]     [facility] [place], [place] [address]     [address], [place]  [phone]      [phone]  [facility]   [facility] [address]    [address] [place] [address]     [place], [place] [address] [phone]       [phone]  [facility] (women only) [address] [place], [place] [address] [phone]  [facility]: [place] (overflow shelter: [address] [place], [place] check in at 6pm for placement in local shelter).  [address], [place], [place] [address] Phone: [phone]  Crisis Services [facility]     [place] [facility]   Crisis Services      [place] [facility] [phone]      [phone] [address]     [address] , [place] [address]     [place],  [address]    [facility] - [phone]  

## 2019-03-18 NOTE — ED Triage Notes (Signed)
Pt to triage via [facility] from [facility] where she is living.  C/o cough with chest wall pain that is worse with coughing, palpation, and movement x 2 days.  Pt had exposure to COVID + person 1 week ago.

## 2019-03-18 NOTE — Progress Notes (Signed)
CSW contacted [facility] who is at capacity and [facility] in [place]. Both facilities are at capacity and are unable to provide shelter for anybody else tonight. CSW placed homelessness resources in patient's discharge paperwork. Patient will benefit following up with [facility] for their housing program at [phone] between 9AM and 5PM Monday through Friday.  Rosette Reveal, LCSW, LCAS Clinical Social Worker II Emergency Department, [facility] Transitions of Care Department, [facility] [phone]

## 2019-03-19 ENCOUNTER — Telehealth: Payer: Self-pay | Admitting: Nurse Practitioner

## 2019-03-19 ENCOUNTER — Other Ambulatory Visit: Payer: Self-pay | Admitting: Nurse Practitioner

## 2019-03-19 DIAGNOSIS — I10 Essential (primary) hypertension: Secondary | ICD-10-CM

## 2019-03-19 DIAGNOSIS — E1169 Type 2 diabetes mellitus with other specified complication: Secondary | ICD-10-CM

## 2019-03-19 DIAGNOSIS — U071 COVID-19: Secondary | ICD-10-CM

## 2019-03-19 LAB — CBG MONITORING, ED: Glucose-Capillary: 209 mg/dL — ABNORMAL HIGH (ref 70–99)

## 2019-03-19 MED ORDER — PANTOPRAZOLE SODIUM 40 MG PO TBEC: 40.0000 mg | DELAYED_RELEASE_TABLET | Freq: Every day | ORAL | 0 refills | Status: DC

## 2019-03-19 MED ORDER — AMLODIPINE BESYLATE 10 MG PO TABS: 10.0000 mg | ORAL_TABLET | Freq: Every day | ORAL | 0 refills | Status: AC

## 2019-03-19 MED ORDER — INSULIN ASPART PROT & ASPART (70-30 MIX) 100 UNIT/ML ~~LOC~~ SUSP: SUBCUTANEOUS | 0 refills | Status: AC

## 2019-03-19 MED FILL — AMLODIPINE BESYLATE 10 MG T: 10 | 30 days supply | Qty: 30 | Fill #0

## 2019-03-19 MED FILL — NOVOLOG MIX 70-30 FLEXPEN S: (70-30) 100 | 30 days supply | Qty: 24 | Fill #0

## 2019-03-19 MED FILL — PANTOPRAZOLE SOD DR 40 MG T: 40 | 30 days supply | Qty: 30 | Fill #0

## 2019-03-19 MED FILL — PENTIPS 31G X 8 MM MISC: 31G X 8 MM | 50 days supply | Qty: 100 | Fill #0

## 2019-03-19 NOTE — ED Notes (Signed)
Ordered breakfast--Othelia Riederer 

## 2019-03-19 NOTE — ED Notes (Addendum)
Pt given pen and paper per request and paperwork for staying at [facility]. Notified CSW that pt belongings still at [facility] on [address] per pt request. Medication refills provided by pharmacy, at bedside sealed. CSW will be sending paperwork to [facility] Dept, [facility] to provide transport to [place].

## 2019-03-19 NOTE — ED Notes (Signed)
Pt's CBG result was 209. Informed April - RN.

## 2019-03-19 NOTE — Discharge Planning (Signed)
EDCM met with pt at bedside to review consent and waiver of liability form.  Pt signed/initaled in correct places.

## 2019-03-19 NOTE — Care Management (Signed)
EDCM reached out tho Chantel of [employer] for Shelter/Hotel coordination for homeless pt.  Will update team when information comes available.

## 2019-03-19 NOTE — Telephone Encounter (Signed)
Called to discuss with Juline Patch about Covid symptoms and the use of bamlanivimab, a monoclonal antibody infusion for those with mild to moderate Covid symptoms and at a high risk of hospitalization.     Pt is qualified for this infusion at the [facility] infusion center due to co-morbid conditions and/or a member of an at-risk group (diabetes, homeless, hypertension).   Patient verbalized understanding of treatment and is requesting to receive infusion. She will require transportation assistance. Patient is homeless but have received a voucher to stay at the [facility] on [facility] [address] [facility]. She reports she is in Room 208.   Mrs. Melendrez verbalized understanding of treatment and appointment details. She is scheduled for 03/21/19 @ 1230pm as requested.   Willette Alma, AGPCNP-BC Pager: [phone] Amion: Thea Alken

## 2019-03-19 NOTE — ED Notes (Signed)
Bedside commode placed in pt's room 

## 2019-03-19 NOTE — Progress Notes (Signed)
  I connected by phone with April Mcgee on 03/19/2019 at 6:54 PM to discuss the potential use of an new treatment for mild to moderate COVID-19 viral infection in non-hospitalized patients.  This patient is a 59 y.o. female that meets the FDA criteria for Emergency Use Authorization of bamlanivimab or casirivimab\imdevimab.  Has a (+) direct SARS-CoV-2 viral test result  Has mild or moderate COVID-19   Is ? 61 years of age and weighs ? 40 kg  Is NOT hospitalized due to COVID-19  Is NOT requiring oxygen therapy or requiring an increase in baseline oxygen flow rate due to COVID-19  Is within 10 days of symptom onset  Has at least one of the high risk factor(s) for progression to severe COVID-19 and/or hospitalization as defined in EUA.  Specific high risk criteria : Diabetes, hypertension, homeless.    I have spoken and communicated the following to the patient or parent/caregiver:  1. FDA has authorized the emergency use of bamlanivimab and casirivimab\imdevimab for the treatment of mild to moderate COVID-19 in adults and pediatric patients with positive results of direct SARS-CoV-2 viral testing who are 29 years of age and older weighing at least 40 kg, and who are at high risk for progressing to severe COVID-19 and/or hospitalization.  2. The significant known and potential risks and benefits of bamlanivimab and casirivimab\imdevimab, and the extent to which such potential risks and benefits are unknown.  3. Information on available alternative treatments and the risks and benefits of those alternatives, including clinical trials.  4. Patients treated with bamlanivimab and casirivimab\imdevimab should continue to self-isolate and use infection control measures (e.g., wear mask, isolate, social distance, avoid sharing personal items, clean and disinfect "high touch" surfaces, and frequent handwashing) according to CDC guidelines.   5. The patient or parent/caregiver has the option to  accept or refuse bamlanivimab or casirivimab\imdevimab .  After reviewing this information with the patient, The patient agreed to proceed with receiving the bamlanimivab infusion and will be provided a copy of the Fact sheet prior to receiving the infusion.[name] 03/19/2019 6:54 PM

## 2019-03-19 NOTE — ED Notes (Signed)
CSW asks RN to clarify which medications pt needs refilled prior to dc, responds "all of them", verified that med reconciliation done with pharmacy, CSW aware.

## 2019-03-19 NOTE — Discharge Planning (Signed)
[facility] coordinating discharge Rx through [facility] pharmacy.

## 2019-03-19 NOTE — ED Notes (Signed)
Lunch tray provided, pt resting comfortably in room, no needs expressed

## 2019-03-19 NOTE — Progress Notes (Addendum)
2pm: CSW spoke with Debbie at [facility] who provided documents that need to be completed for hotel placement. CSW completed first portion and the rest was given to patient by RN for completion.  CSW will send documents once patient is finished.  11am: CSW and RN CM spoke with Chantel of the Health Department to request her assistance finding shelter for this patient.  10:30am: CSW spoke with Eunice Blase to discuss this patient. Debbie informed CSW to call shelters for placement.  CSW spoke with RN to inquire with patient about which hotel she was staying at prior to arrival. Patient informed RN she has been staying at [facility] on [address].   CSW spoke with receptionist at [facility] who states there are no shelter beds available today.  CSW spoke with receptionist at [facility] who stated there are no shelter beds available today.  9am: CSW attempted to reach Debbie at [facility] [facility], a voicemail was left requesting a return call.  Edwin Dada, MSW, LCSW-A Transitions of Care  Clinical Social Worker  [facility] Emergency Departments  Medical ICU [phone]

## 2019-03-19 NOTE — ED Notes (Signed)
Meds to be delivered from pharm per CSW, placement in covid hotel pending. Assisted into fresh gown.

## 2019-03-21 ENCOUNTER — Ambulatory Visit (HOSPITAL_COMMUNITY)
Admission: RE | Admit: 2019-03-21 | Discharge: 2019-03-21 | Disposition: A | Discharge status: Home / Self Care | Payer: Medicaid Other | Source: Ambulatory Visit | Attending: Pulmonary Disease | Admitting: Pulmonary Disease

## 2019-03-21 DIAGNOSIS — I10 Essential (primary) hypertension: Secondary | ICD-10-CM | POA: Diagnosis present

## 2019-03-21 DIAGNOSIS — U071 COVID-19: Secondary | ICD-10-CM | POA: Insufficient documentation

## 2019-03-21 DIAGNOSIS — E1169 Type 2 diabetes mellitus with other specified complication: Secondary | ICD-10-CM | POA: Diagnosis present

## 2019-03-21 DIAGNOSIS — Z23 Encounter for immunization: Secondary | ICD-10-CM | POA: Insufficient documentation

## 2019-03-21 MED ORDER — FAMOTIDINE IN NACL 20-0.9 MG/50ML-% IV SOLN: 20.0000 mg | Freq: Once | INTRAVENOUS | Status: DC | PRN

## 2019-03-21 MED ORDER — SODIUM CHLORIDE 0.9 % IV SOLN
INTRAVENOUS | Status: DC | PRN
  Administered 2019-03-21: 250 mL via INTRAVENOUS

## 2019-03-21 MED ORDER — METHYLPREDNISOLONE SODIUM SUCC 125 MG IJ SOLR: 125.0000 mg | Freq: Once | INTRAMUSCULAR | Status: DC | PRN

## 2019-03-21 MED ORDER — DIPHENHYDRAMINE HCL 50 MG/ML IJ SOLN: 50.0000 mg | Freq: Once | INTRAMUSCULAR | Status: DC | PRN

## 2019-03-21 MED ORDER — EPINEPHRINE 0.3 MG/0.3ML IJ SOAJ: 0.3000 mg | Freq: Once | INTRAMUSCULAR | Status: DC | PRN

## 2019-03-21 MED ORDER — SODIUM CHLORIDE 0.9 % IV SOLN
700.0000 mg | Freq: Once | INTRAVENOUS | Status: AC
  Administered 2019-03-21: 13:00:00 700 mg via INTRAVENOUS
  Filled 2019-03-21: qty 20

## 2019-03-21 MED ORDER — ALBUTEROL SULFATE HFA 108 (90 BASE) MCG/ACT IN AERS: 2.0000 | INHALATION_SPRAY | Freq: Once | RESPIRATORY_TRACT | Status: DC | PRN

## 2019-03-21 NOTE — Progress Notes (Signed)
  Diagnosis: COVID-19  Physician: Dr. Wright  Procedure: [facility] Infusion [facility] Med: bamlanivimab infusion - Provided patient with bamlanimivab fact sheet for patients, parents and caregivers prior to infusion.  Complications: No immediate complications noted.  Discharge: Discharged home   April Mcgee 03/21/2019  

## 2019-03-21 NOTE — Discharge Instructions (Signed)

## 2019-03-22 ENCOUNTER — Emergency Department (HOSPITAL_COMMUNITY): Payer: Medicaid Other

## 2019-03-22 ENCOUNTER — Other Ambulatory Visit: Payer: Self-pay

## 2019-03-22 ENCOUNTER — Telehealth: Payer: Self-pay | Admitting: Physician Assistant

## 2019-03-22 ENCOUNTER — Encounter (HOSPITAL_COMMUNITY): Payer: Self-pay

## 2019-03-22 ENCOUNTER — Emergency Department (HOSPITAL_COMMUNITY)
Admission: EM | Admit: 2019-03-22 | Discharge: 2019-03-22 | Disposition: A | Discharge status: Home / Self Care | Payer: Medicaid Other | Attending: Emergency Medicine | Admitting: Emergency Medicine

## 2019-03-22 DIAGNOSIS — A599 Trichomoniasis, unspecified: Secondary | ICD-10-CM | POA: Diagnosis not present

## 2019-03-22 DIAGNOSIS — R0789 Other chest pain: Secondary | ICD-10-CM | POA: Insufficient documentation

## 2019-03-22 DIAGNOSIS — Z79899 Other long term (current) drug therapy: Secondary | ICD-10-CM | POA: Diagnosis not present

## 2019-03-22 DIAGNOSIS — Z794 Long term (current) use of insulin: Secondary | ICD-10-CM | POA: Diagnosis not present

## 2019-03-22 DIAGNOSIS — M7918 Myalgia, other site: Secondary | ICD-10-CM | POA: Diagnosis not present

## 2019-03-22 DIAGNOSIS — F17210 Nicotine dependence, cigarettes, uncomplicated: Secondary | ICD-10-CM | POA: Diagnosis not present

## 2019-03-22 DIAGNOSIS — U071 COVID-19: Secondary | ICD-10-CM | POA: Diagnosis not present

## 2019-03-22 DIAGNOSIS — E119 Type 2 diabetes mellitus without complications: Secondary | ICD-10-CM | POA: Diagnosis not present

## 2019-03-22 DIAGNOSIS — R0602 Shortness of breath: Secondary | ICD-10-CM | POA: Diagnosis present

## 2019-03-22 DIAGNOSIS — R5383 Other fatigue: Secondary | ICD-10-CM | POA: Insufficient documentation

## 2019-03-22 LAB — CBC
HCT: 42.6 % (ref 36.0–46.0)
Hemoglobin: 13.4 g/dL (ref 12.0–15.0)
MCH: 27.4 pg (ref 26.0–34.0)
MCHC: 31.5 g/dL (ref 30.0–36.0)
MCV: 87.1 fL (ref 80.0–100.0)
Platelets: 154 10*3/uL (ref 150–400)
RBC: 4.89 MIL/uL (ref 3.87–5.11)
RDW: 13.5 % (ref 11.5–15.5)
WBC: 3.3 10*3/uL — ABNORMAL LOW (ref 4.0–10.5)
nRBC: 0 % (ref 0.0–0.2)

## 2019-03-22 LAB — BASIC METABOLIC PANEL
Anion gap: 9 (ref 5–15)
BUN: 11 mg/dL (ref 6–20)
CO2: 22 mmol/L (ref 22–32)
Calcium: 8.9 mg/dL (ref 8.9–10.3)
Chloride: 109 mmol/L (ref 98–111)
Creatinine, Ser: 1.11 mg/dL — ABNORMAL HIGH (ref 0.44–1.00)
GFR calc Af Amer: >60 mL/min (ref >60)
GFR calc non Af Amer: 54 mL/min — ABNORMAL LOW (ref >60)
Glucose, Bld: 124 mg/dL — ABNORMAL HIGH (ref 70–99)
Potassium: 3.9 mmol/L (ref 3.5–5.1)
Sodium: 140 mmol/L (ref 135–145)

## 2019-03-22 LAB — TROPONIN I (HIGH SENSITIVITY)
Troponin I (High Sensitivity): 5 ng/L (ref <18)
Troponin I (High Sensitivity): 5 ng/L (ref <18)

## 2019-03-22 LAB — WET PREP, GENITAL
Clue Cells Wet Prep HPF POC: NONE SEEN
Sperm: NONE SEEN
Yeast Wet Prep HPF POC: NONE SEEN

## 2019-03-22 LAB — HEPATIC FUNCTION PANEL
ALT: 16 U/L (ref 0–44)
AST: 17 U/L (ref 15–41)
Albumin: 2.9 g/dL — ABNORMAL LOW (ref 3.5–5.0)
Alkaline Phosphatase: 63 U/L (ref 38–126)
Bilirubin, Direct: <0.1 mg/dL (ref 0.0–0.2)
Total Bilirubin: 0.7 mg/dL (ref 0.3–1.2)
Total Protein: 6.3 g/dL — ABNORMAL LOW (ref 6.5–8.1)

## 2019-03-22 LAB — LIPASE, BLOOD: Lipase: 17 U/L (ref 11–51)

## 2019-03-22 MED ORDER — NAPROXEN 250 MG PO TABS: 250.0000 mg | ORAL_TABLET | Freq: Two times a day (BID) | ORAL | 0 refills | Status: DC

## 2019-03-22 MED ORDER — METRONIDAZOLE 500 MG PO TABS
2000.0000 mg | ORAL_TABLET | Freq: Once | ORAL | Status: AC
  Administered 2019-03-22: 2000 mg via ORAL
  Filled 2019-03-22: qty 4

## 2019-03-22 MED ORDER — MELATONIN 3 MG PO TABS: 3.0000 mg | ORAL_TABLET | Freq: Every evening | ORAL | 0 refills | Status: AC

## 2019-03-22 MED ORDER — DIPHENHYDRAMINE HCL 25 MG PO CAPS
25.0000 mg | ORAL_CAPSULE | Freq: Once | ORAL | Status: AC
  Administered 2019-03-22: 25 mg via ORAL
  Filled 2019-03-22: qty 1

## 2019-03-22 MED ORDER — ACETAMINOPHEN 325 MG PO TABS
650.0000 mg | ORAL_TABLET | Freq: Once | ORAL | Status: AC
  Administered 2019-03-22: 15:00:00 650 mg via ORAL
  Filled 2019-03-22: qty 2

## 2019-03-22 NOTE — ED Notes (Signed)
Pt ambulated in room and O2 stayed at 100% RA while ambulating. Pt stated she felt SOB and weak.

## 2019-03-22 NOTE — ED Notes (Signed)
Pt ambulated well to the BR with no increase in SOB and no episodes of dizziness,

## 2019-03-22 NOTE — ED Triage Notes (Signed)
Pt BIB [facility] for eval of SOB and fatigue since being dx'd w/ Covid on Sunday. Pt endorses intermittent tightness in chest and generalized malaise.

## 2019-03-22 NOTE — Telephone Encounter (Signed)
Patient called and asking if she needs 2nd monoclonal antibody infusion. Reassured with recommendations.

## 2019-03-22 NOTE — ED Provider Notes (Signed)
[facility] EMERGENCY DEPARTMENT Provider Note   CSN: [id number] Arrival date & time: 03/22/19  1305     History Chief Complaint  Patient presents with  . Shortness of Breath  . Covid    Loany Neuroth is a 59 y.o. female history of diabetes, hyperlipidemia, hypertension.  Patient brought in by EMS today for shortness of breath, fatigue and generalized body aches.  Patient diagnosed with COVID-19 viral infection on 03/18/2015.  She reports that symptom onset was shortly prior to testing positive.  She reports shortness of breath as a feeling of inability to catch her breath with exertion and improves with rest.  She reports fatigue as generalized tiredness without focal weakness.  She describes body aches as diffuse aching sensation to both of her arms and legs constant worsened with movement no alleviating factors or radiation of pain and moderate in intensity.  She reports tactile fevers and chills but has not measured a fever.  She has been staying in a local motel since becoming Covid positive.  Additionally patient reports a diffuse chest pain sharp constant worsening today no clear onset of symptoms worsened with cough no alleviating factors.  Of note patient had 1 monoclonal antibody infusion yesterday, she reports no change to her symptoms since that infusion.  Denies headache, vision changes, neck pain, sore throat, hemoptysis, vomiting, diarrhea, abdominal pain, rash, extremity swelling/color change or any additional concerns. HPI     Past Medical History:  Diagnosis Date  . Diabetes mellitus   . Hyperlipidemia   . Hypertension     There are no problems to display for this patient.   History reviewed. No pertinent surgical history.   OB History   No obstetric history on file.     History reviewed. No pertinent family history.  Social History   Tobacco Use  . Smoking status: Current Every Day Smoker    Packs/day: 0.50  . Smokeless tobacco:  Never Used  Substance Use Topics  . Alcohol use: No  . Drug use: No    Home Medications Prior to Admission medications   Medication Sig Start Date End Date Taking? Authorizing Provider  acetaminophen (TYLENOL) 650 MG CR tablet Take 650 mg by mouth every 8 (eight) hours as needed for pain.   Yes [provider]  amLODipine (NORVASC) 10 MG tablet Take 1 tablet (10 mg total) by mouth daily. 03/19/19  Yes Benjiman Core, MD  ibuprofen (ADVIL,MOTRIN) 600 MG tablet Take 600 mg by mouth every 12 (twelve) hours as needed for mild pain or moderate pain.   Yes [provider]  insulin aspart protamine- aspart (NOVOLOG MIX 70/30) (70-30) 100 UNIT/ML injection 30 units with breakfast and 50 units with dinner. 03/19/19  Yes Benjiman Core, MD  pantoprazole (PROTONIX) 40 MG tablet Take 1 tablet (40 mg total) by mouth daily. 03/19/19  Yes Benjiman Core, MD  cephALEXin (KEFLEX) 500 MG capsule Take 1 capsule (500 mg total) by mouth 4 (four) times daily. Patient not taking: Reported on 03/19/2019 09/25/18   Mesner, Barbara Cower, MD  naproxen (NAPROSYN) 500 MG tablet Take 1 tablet (500 mg total) by mouth 2 (two) times daily. Patient not taking: Reported on 03/19/2019 12/20/15   Dione Booze, MD  ondansetron (ZOFRAN) 4 MG tablet Take 1 tablet (4 mg total) by mouth every 6 (six) hours. Patient not taking: Reported on 03/19/2019 12/17/11   Nelva Nay, MD  orphenadrine (NORFLEX) 100 MG tablet Take 1 tablet (100 mg total) by mouth 2 (two) times  daily. Patient not taking: Reported on 03/19/2019 12/20/15   Dione Booze, MD  promethazine (PHENERGAN) 25 MG tablet Take 1 tablet (25 mg total) by mouth every 6 (six) hours as needed for nausea. Patient not taking: Reported on 03/19/2019 02/22/14   Benjiman Core, MD    Allergies    Patient has no known allergies.  Review of Systems   Review of Systems Ten systems are reviewed and are negative for acute change except as noted in the HPI  Physical  Exam Updated Vital Signs BP 140/73 (BP Location: Left Arm)   Pulse 65   Temp 98.8 F (37.1 C) (Oral)   Resp 14   Ht 5\' 4"  (1.626 m)   Wt 68 kg   SpO2 100%   BMI 25.75 kg/m   Physical Exam Constitutional:      General: She is not in acute distress.    Appearance: Normal appearance. She is well-developed. She is not ill-appearing or diaphoretic.  HENT:     Head: Normocephalic and atraumatic.     Right Ear: External ear normal.     Left Ear: External ear normal.     Nose: Nose normal.  Eyes:     General: Vision grossly intact. Gaze aligned appropriately.     Pupils: Pupils are equal, round, and reactive to light.  Neck:     Trachea: Trachea and phonation normal. No tracheal deviation.  Cardiovascular:     Rate and Rhythm: Normal rate and regular rhythm.  Pulmonary:     Effort: Pulmonary effort is normal. No respiratory distress.     Breath sounds: Normal breath sounds.  Chest:     Chest wall: Tenderness present. No deformity or crepitus.  Abdominal:     General: There is no distension.     Palpations: Abdomen is soft.     Tenderness: There is no abdominal tenderness. There is no guarding or rebound.  Musculoskeletal:        General: Normal range of motion.     Cervical back: Normal range of motion.     Right lower leg: No tenderness. No edema.     Left lower leg: No tenderness. No edema.  Skin:    General: Skin is warm and dry.  Neurological:     Mental Status: She is alert.     GCS: GCS eye subscore is 4. GCS verbal subscore is 5. GCS motor subscore is 6.     Comments: Speech is clear and goal oriented, follows commands Major Cranial nerves without deficit, no facial droop Moves extremities without ataxia, coordination intact  Psychiatric:        Behavior: Behavior normal.     ED Results / Procedures / Treatments   Labs (all labs ordered are listed, but only abnormal results are displayed) Labs Reviewed  CBC - Abnormal; Notable for the following components:       Result Value   WBC 3.3 (*)    All other components within normal limits  BASIC METABOLIC PANEL  HEPATIC FUNCTION PANEL  LIPASE, BLOOD  TROPONIN I (HIGH SENSITIVITY)    EKG None  Radiology DG Chest Portable 1 View  Result Date: 03/22/2019 CLINICAL DATA:  Short of breath, COVID-19 positive EXAM: PORTABLE CHEST 1 VIEW COMPARISON:  03/18/2019 FINDINGS: Single frontal view of the chest demonstrates a stable cardiac silhouette. Mild interstitial prominence throughout the lungs is stable, without airspace disease, effusion, or pneumothorax. No acute bony abnormalities. IMPRESSION: 1. No acute process. Electronically Signed   By: 05/16/2019  Owens Shark M.D.   On: 03/22/2019 15:19    Procedures Procedures (including critical care time)  Medications Ordered in ED Medications  acetaminophen (TYLENOL) tablet 650 mg (650 mg Oral Given 03/22/19 1505)    ED Course  I have reviewed the triage vital signs and the nursing notes.  Pertinent labs & imaging results that were available during my care of the patient were reviewed by me and considered in my medical decision making (see chart for details).    MDM Rules/Calculators/A&P                     59 year old female Covid positive patient presenting in approximately at her fifth day of illness.  Multiple complaints including body aches, chest pain, shortness of breath, fatigue.  On initial evaluation she was sleeping easily arousable to voice.  Vital signs stable on room air.  Cranial nerves intact, no meningeal signs, heart regular rate and rhythm without murmur, chest is tender to palpation, abdomen soft nontender no peritoneal signs, neurovascular intact to all 4 extremities without evidence of DVT. - Suspect patient symptoms today are secondary to COVID-19 viral infection, will obtain chest pain work-up.  Due to lack of tachycardia hypoxia or pleurisy doubt pulmonary embolism at this time.  Patient will remain on cardiac monitor throughout visit.   She will be ambulated by nursing staff to evaluate for hypoxia. - CBC shows leukopenia of 3.3 which is likely secondary to COVID-19 viral infection Chest x-ray:    IMPRESSION:  1. No acute process.   I have personally reviewed the chest x-ray and agree with radiologist interpretation - Remainder of labs and work-up is pending at this time.  Chart review shows patient ambulated with nursing staff and O2 remained at 100% on room air with ambulation.  Care handoff given to Dr. Yong Channel at shift change.  Disposition per oncoming team.  Sable Feil was evaluated in Emergency Department on 03/22/2019 for the symptoms described in the history of present illness. She was evaluated in the context of the global COVID-19 pandemic, which necessitated consideration that the patient might be at risk for infection with the SARS-CoV-2 virus that causes COVID-19. Institutional protocols and algorithms that pertain to the evaluation of patients at risk for COVID-19 are in a state of rapid change based on information released by regulatory bodies including the CDC and federal and state organizations. These policies and algorithms were followed during the patient's care in the ED.  Note: Portions of this report may have been transcribed using voice recognition software. Every effort was made to ensure accuracy; however, inadvertent computerized transcription errors may still be present.  Final Clinical Impression(s) / ED Diagnoses Final diagnoses:  None    Rx / DC Orders ED Discharge Orders    None       Gari Crown 03/22/19 1531    Isla Pence, MD 03/22/19 1542

## 2019-03-22 NOTE — ED Notes (Signed)
Received call from lab, BMP needs redrawn due to contaminate. Lab reordered and phlebotomy notified.

## 2019-03-22 NOTE — ED Provider Notes (Signed)
Medical Decision Making: Care of patient assumed from [facility] PA-C at 1530.  Agree with history, physical exam and plan.  See their note for further details.  Briefly, 58 y.o. female with PMH/PSH as below.  Past Medical History:  Diagnosis Date  . Diabetes mellitus   . Hyperlipidemia   . Hypertension    History reviewed. No pertinent surgical history.   Patient HDS at handoff.  Plan at time of handoff: Here for weakness, bodyaches, SOB, known covid positive.   Needs troponins then likely home  ED Course Rechecked the pt who is complaining of vaginal discharge and also requesting "something to help me sleep and something for my bodyaches when I go home".    Pelvic exam with friable cervix with thin yellow discharge, no CMT or adnexal TTP, no bleeding.    Wet prep positive for trichomonas, given treatment with flagyl.  GC/Chl sent, instructed to inform sexual partners for need for evaluation.    Troponin negative x 2, do not suspect ACS at this time.    Given rx for melatonin, naproxen.  Requesting something for sleep prior to discharge, will get ride home.  Given 25mg  benadryl.    Strict return precautions given.  Discharged in stable condition.    Clinical Impression 1. COVID-19   2. Trichomonas infection      Patient care provided under supervision of my attending, Dr. .    Daman Steffenhagen, Stevie Kern, MD 03/23/19 05/21/19    [id number], MD 03/23/19 [phone]

## 2019-03-22 NOTE — ED Notes (Signed)
Patient verbalizes understanding of discharge instructions. Opportunity for questioning and answers were provided. Armband removed by staff, pt discharged from ED ambulatory to home.  

## 2019-03-24 LAB — GC/CHLAMYDIA PROBE AMP (~~LOC~~) NOT AT ARMC
Chlamydia: NEGATIVE
Neisseria Gonorrhea: NEGATIVE

## 2020-06-20 ENCOUNTER — Other Ambulatory Visit: Payer: Self-pay | Admitting: Family Medicine

## 2020-06-20 DIAGNOSIS — Z1231 Encounter for screening mammogram for malignant neoplasm of breast: Secondary | ICD-10-CM

## 2020-08-14 ENCOUNTER — Ambulatory Visit
Admission: RE | Admit: 2020-08-14 | Discharge: 2020-08-14 | Disposition: A | Discharge status: Home / Self Care | Payer: Medicaid Other | Source: Ambulatory Visit | Attending: Family Medicine | Admitting: Family Medicine

## 2020-08-14 ENCOUNTER — Other Ambulatory Visit: Payer: Self-pay

## 2020-08-14 DIAGNOSIS — Z1231 Encounter for screening mammogram for malignant neoplasm of breast: Secondary | ICD-10-CM

## 2020-09-02 ENCOUNTER — Other Ambulatory Visit: Payer: Self-pay | Admitting: Family Medicine

## 2020-09-02 DIAGNOSIS — R928 Other abnormal and inconclusive findings on diagnostic imaging of breast: Secondary | ICD-10-CM

## 2020-09-18 ENCOUNTER — Other Ambulatory Visit: Payer: Self-pay

## 2020-09-18 ENCOUNTER — Ambulatory Visit
Admission: RE | Admit: 2020-09-18 | Discharge: 2020-09-18 | Disposition: A | Discharge status: Home / Self Care | Payer: Medicaid Other | Source: Ambulatory Visit | Attending: Family Medicine | Admitting: Family Medicine

## 2020-09-18 DIAGNOSIS — R928 Other abnormal and inconclusive findings on diagnostic imaging of breast: Secondary | ICD-10-CM

## 2020-11-12 ENCOUNTER — Other Ambulatory Visit: Payer: Self-pay | Admitting: Family Medicine

## 2020-11-12 ENCOUNTER — Ambulatory Visit
Admission: RE | Admit: 2020-11-12 | Discharge: 2020-11-12 | Disposition: A | Discharge status: Home / Self Care | Payer: Medicaid Other | Source: Ambulatory Visit | Attending: Family Medicine | Admitting: Family Medicine

## 2020-11-12 DIAGNOSIS — M25559 Pain in unspecified hip: Secondary | ICD-10-CM

## 2020-11-12 DIAGNOSIS — M5489 Other dorsalgia: Secondary | ICD-10-CM

## 2020-11-27 IMAGING — CT CT HEAD WITHOUT CONTRAST
3 of 4 series · 13 of 47 positions shown, 15 images · non-contrast
Comparison: None available.

CLINICAL DATA: Initial evaluation for acute altered mental status.

EXAM:
CT HEAD WITHOUT CONTRAST
TECHNIQUE: Contiguous axial images were obtained from the base of the skull
through the vertex without intravenous contrast.

[Series 3: head wo · axial · 0.49mm/px · z∈[+1342,+1462]mm · 7 of 32 slices shown, 9 images]
[im 4/32  brain]
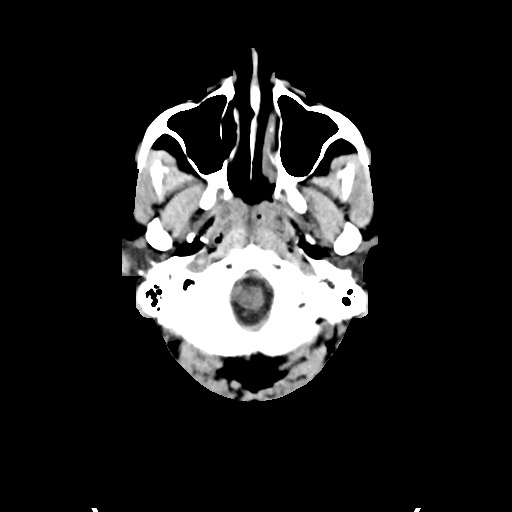
[im 4/32  bone]
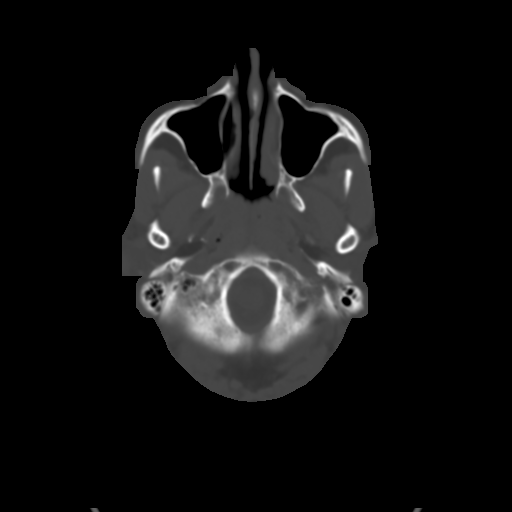
[im 8/32  brain]
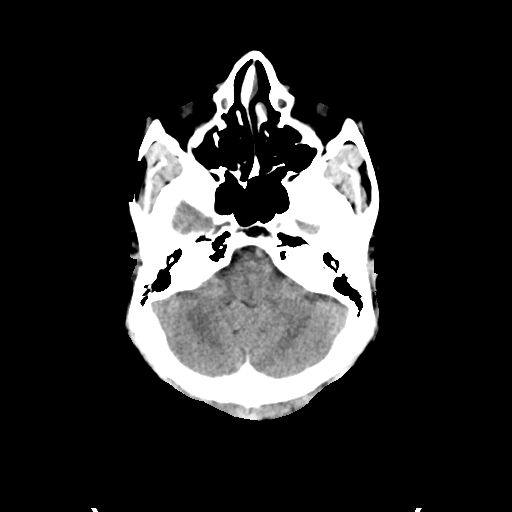
[im 12/32  brain]
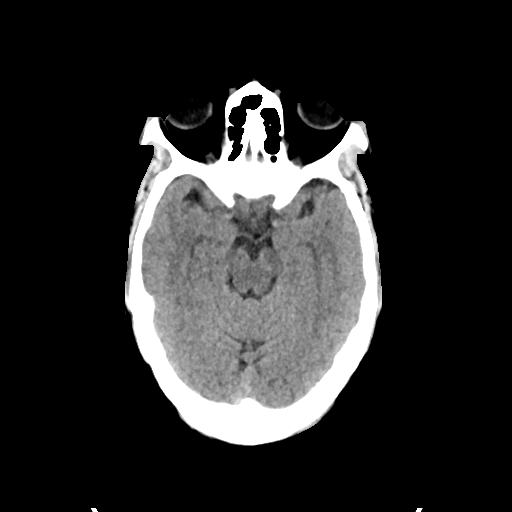
[im 16/32  brain]
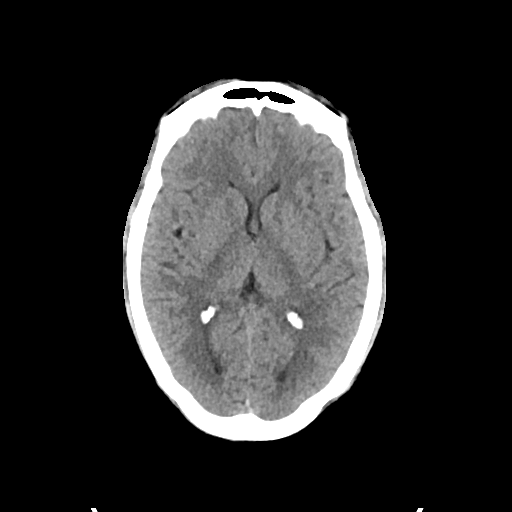
[im 20/32  brain]
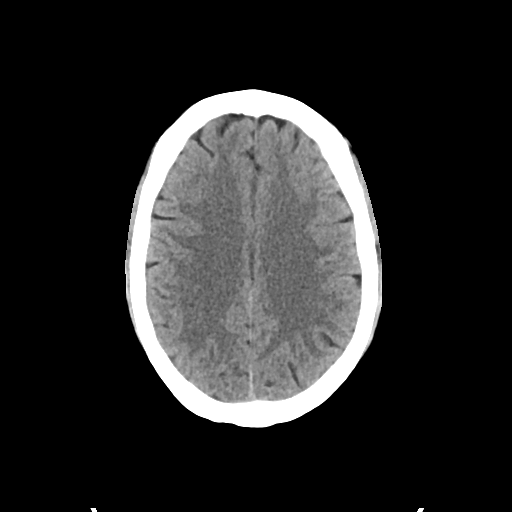
[im 20/32  bone]
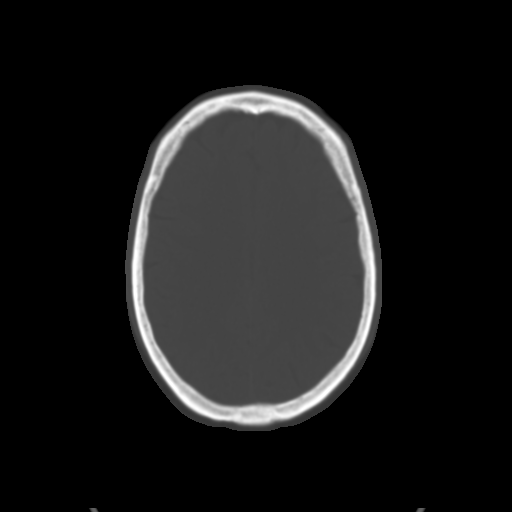
[im 24/32  brain]
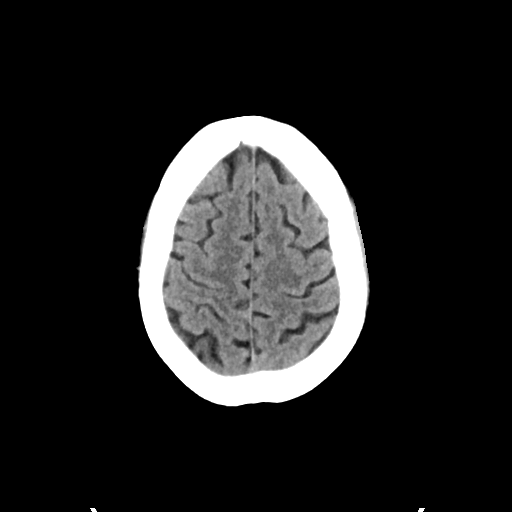
[im 28/32  brain]
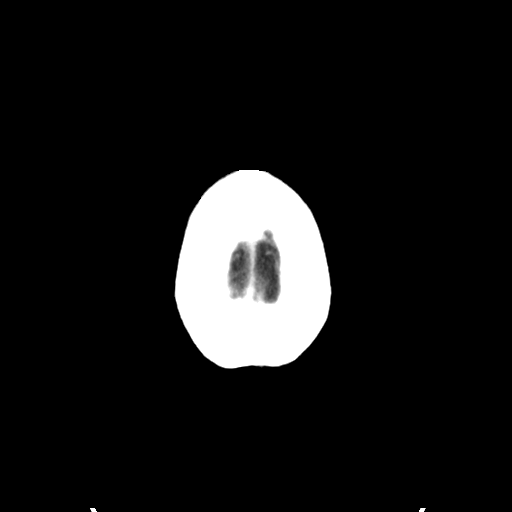

[Series 5: cor soft · coronal · 0.31mm/px · 3 of 70 slices shown]
[im 24/70  brain]
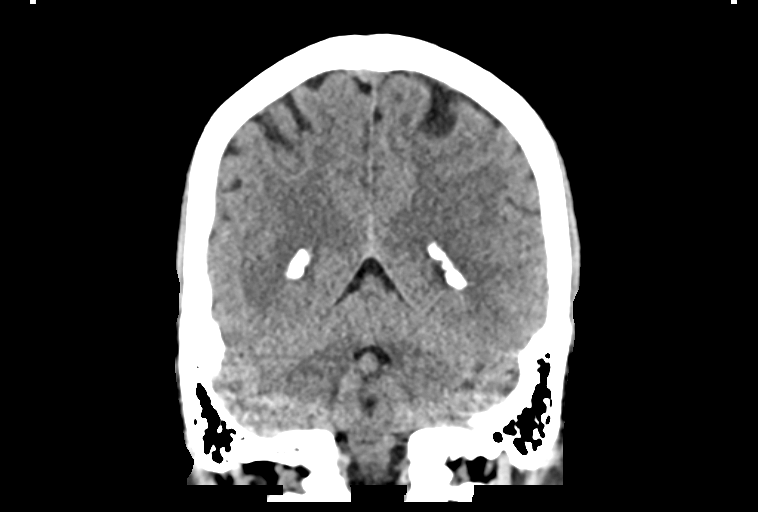
[im 31/70  brain]
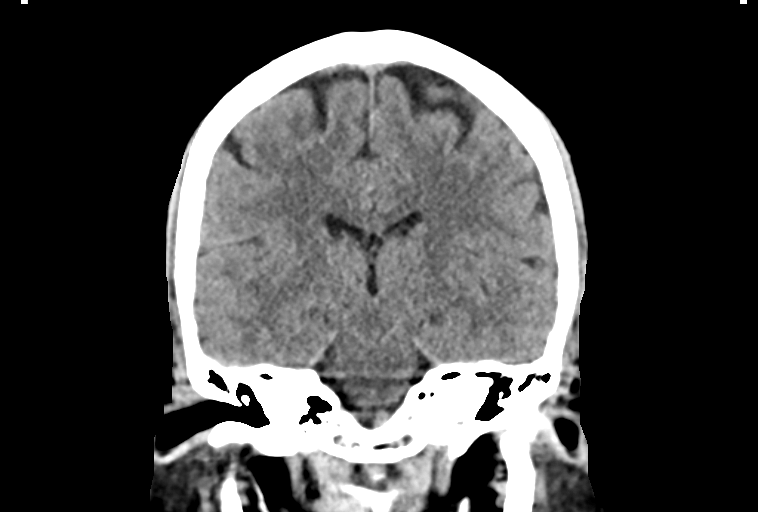
[im 39/70  brain]
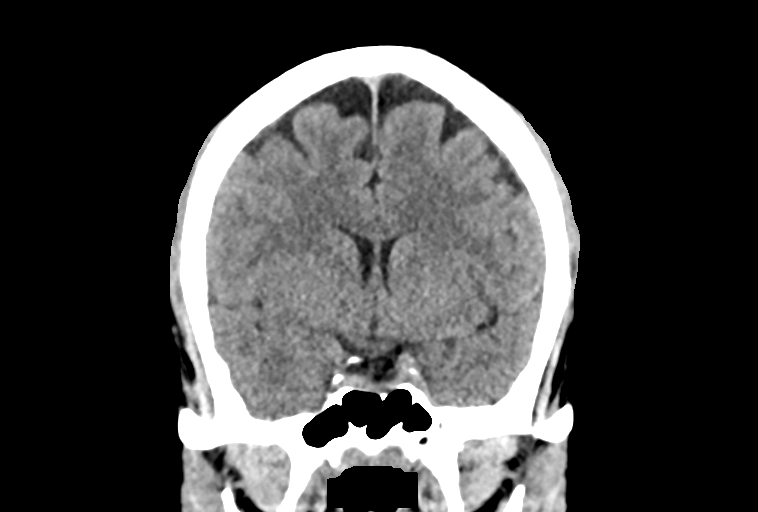

[Series 6: sag soft · sagittal · 0.31mm/px · 3 of 64 slices shown]
[im 22/64  brain]
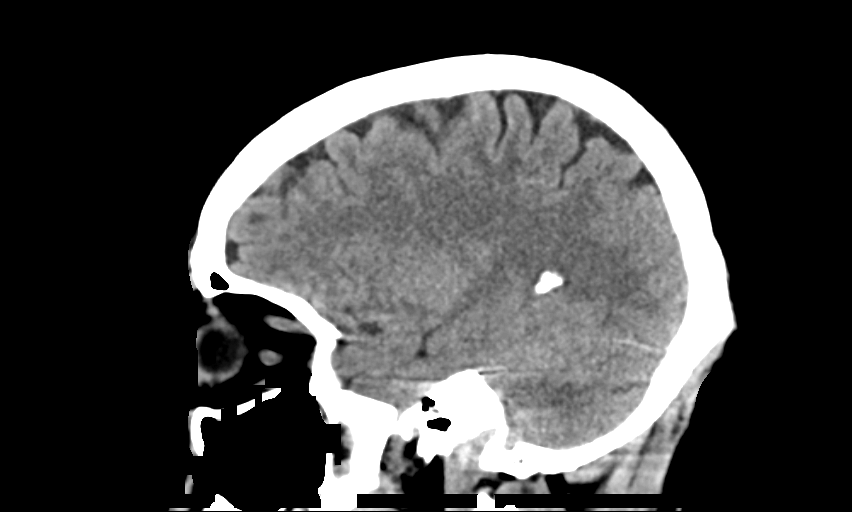
[im 32/64  brain]
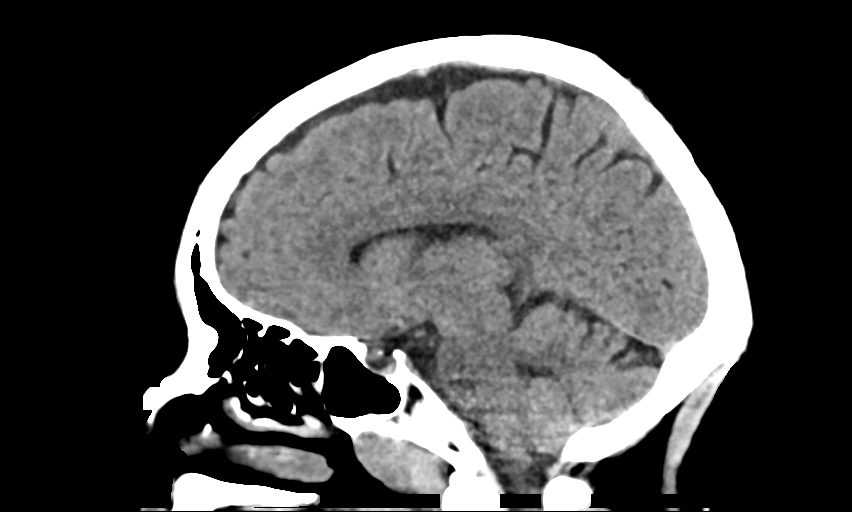
[im 43/64  brain]
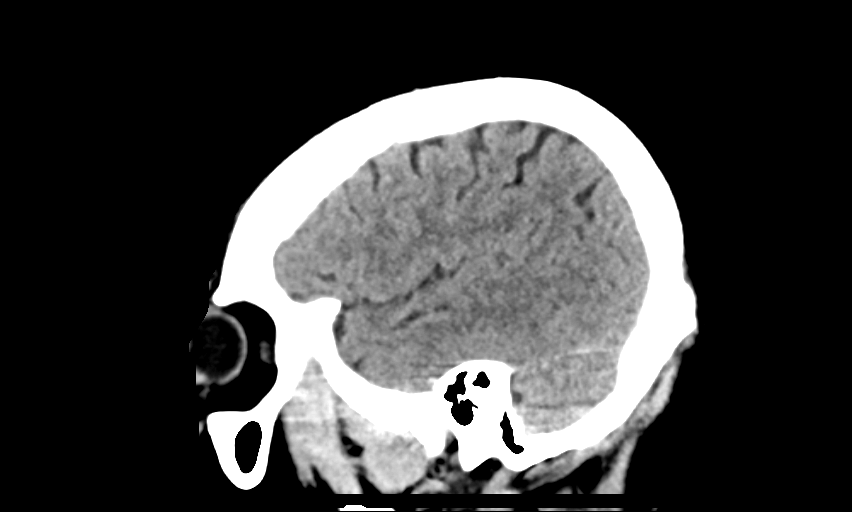

[13 of 47 positions shown; findings below may reference images not displayed]

FINDINGS: Brain: Cerebral volume within normal limits for patient age.

No evidence for acute intracranial hemorrhage. No findings to
suggest acute large vessel territory infarct. No mass lesion,
midline shift, or mass effect. Ventricles are normal in size without
evidence for hydrocephalus. No extra-axial fluid collection
identified.

Vascular: No hyperdense vessel identified.

Skull: Scalp soft tissues demonstrate no acute abnormality.
Calvarium intact.

Sinuses/Orbits: Globes and orbital soft tissues within normal
limits. Remote posttraumatic defect noted at the right lamina
papyracea.

Small right maxillary sinus retention cyst. Paranasal sinuses are
otherwise largely clear. No mastoid effusion.
IMPRESSION: Normal head CT.  No acute intracranial abnormality identified.

## 2020-12-18 ENCOUNTER — Other Ambulatory Visit: Payer: Self-pay

## 2020-12-18 ENCOUNTER — Encounter: Payer: Self-pay | Admitting: Surgery

## 2020-12-18 ENCOUNTER — Ambulatory Visit (INDEPENDENT_AMBULATORY_CARE_PROVIDER_SITE_OTHER): Payer: Medicaid Other | Admitting: Surgery

## 2020-12-18 VITALS — BP 136/83 | HR 64 | Ht 65.5 in

## 2020-12-18 DIAGNOSIS — G8929 Other chronic pain: Secondary | ICD-10-CM

## 2020-12-18 DIAGNOSIS — M2550 Pain in unspecified joint: Secondary | ICD-10-CM | POA: Diagnosis not present

## 2020-12-18 DIAGNOSIS — M25559 Pain in unspecified hip: Secondary | ICD-10-CM | POA: Diagnosis not present

## 2020-12-18 DIAGNOSIS — M1611 Unilateral primary osteoarthritis, right hip: Secondary | ICD-10-CM

## 2020-12-18 DIAGNOSIS — M545 Low back pain, unspecified: Secondary | ICD-10-CM

## 2020-12-18 DIAGNOSIS — M533 Sacrococcygeal disorders, not elsewhere classified: Secondary | ICD-10-CM

## 2020-12-18 DIAGNOSIS — M1612 Unilateral primary osteoarthritis, left hip: Secondary | ICD-10-CM | POA: Diagnosis not present

## 2020-12-18 NOTE — Progress Notes (Signed)
Office Visit Note   Patient: April Mcgee           Date of Birth: 02-01-1961           MRN: [id number] Visit Date: 12/18/2020              Requested by: Kerin Perna, NP [address] [place],  [place] [address] PCP: Joycelyn Man, FNP   Assessment & Plan: Visit Diagnoses:  1. Polyarthralgia   2. Hip pain   3. Arthritis of right hip   4. Arthritis of left hip   5. Chronic left SI joint pain   6. Chronic right SI joint pain   7. Acute bilateral low back pain without sciatica     Plan: With patient's polyarthralgia I did blood work today to check a CBC and arthritis panel with HLA-B27.  I will have patient follow-up with me in 1 week to see how she is doing and review her blood work.  Advised patient in hopes of getting her pain settled down I wanted to give her a 6-day prednisone taper but with her history of diabetes I do not feel comfortable doing this due to the potential for this to elevate her blood sugars.  I was able to speak with referring provider Earnestine Leys nurse practitioner and discussed with her my plan for today and asked if she would mind prescribing prednisone taper since she is more aware of patient's diabetes history.  She very kindly agreed to do so.  I attempted to call patient to let her know that the prescription will be sent in but I did not leave a message on her voicemail.  Follow-Up Instructions: Return in about 1 week (around 12/25/2020) for with Aurielle Slingerland review labs  .   Orders:  Orders Placed This Encounter  Procedures   CBC   Sed Rate (ESR)   Antinuclear Antib (ANA)   Rheumatoid Factor   Uric acid   HLA-B27 antigen   No orders of the defined types were placed in this encounter.     Procedures: No procedures performed   Clinical Data: No additional findings.   Subjective: Chief Complaint  Patient presents with   Lower Back - New Patient (Initial Visit)    HPI 60 year old female who is a new patient to clinic comes in with  multiple complaints.  She complains of low back pain, bilateral hip pain bilateral wrist and hand pain.  States that all areas worsening over the last 3 months.  No injury.  No previous surgeries.  States that she does have a history of gout.  Low back pain and bilateral groin pain increased with walking.  Bilateral wrist and hand aching for a couple years.  Patient's primary care provider has been trying to manage her complaints. Review of Systems No current cardiac pulmonary GI GU issues  Objective: Vital Signs: BP 136/83 (BP Location: Right Arm, Patient Position: Sitting, Cuff Size: Normal)   Pulse 64   Ht 5' 5.5" (1.664 m)   SpO2 98%   BMI 24.58 kg/m   Physical Exam HENT:     Head: Normocephalic and atraumatic.  Pulmonary:     Effort: No respiratory distress.  Musculoskeletal:     Comments: Exam gait is somewhat antalgic.  Pleasant female alert and oriented in no acute distress.  Positive bilateral SI joint tenderness.  She has tenderness over the bilateral hip greater trochanter bursa's and this extends down both IT bands.  Positive left sciatic notch tenderness.  Negative bilateral straight leg raise.  Bilateral hip internal/external rotation about 20 to 30 degrees with complaints of groin pain.  Bilateral shoulders positive impingement testing.  Bilateral elbows good range of motion.  Elbows are somewhat tender to palpation.  Neurological:     Mental Status: She is alert and oriented to person, place, and time.  Psychiatric:        Mood and Affect: Mood normal.    Ortho Exam  Specialty Comments:  No specialty comments available.  Imaging: No results found.   PMFS History: There are no problems to display for this patient.  Past Medical History:  Diagnosis Date   Diabetes mellitus    Hyperlipidemia    Hypertension     History reviewed. No pertinent family history.  History reviewed. No pertinent surgical history. Social History   Occupational History   Not on  file  Tobacco Use   Smoking status: Every Day    Packs/day: 0.50    Types: Cigarettes   Smokeless tobacco: Never  Substance and Sexual Activity   Alcohol use: No   Drug use: No   Sexual activity: Not on file

## 2020-12-21 LAB — CBC
HCT: 40.1 % (ref 35.0–45.0)
Hemoglobin: 13.2 g/dL (ref 11.7–15.5)
MCH: 28.1 pg (ref 27.0–33.0)
MCHC: 32.9 g/dL (ref 32.0–36.0)
MCV: 85.5 fL (ref 80.0–100.0)
MPV: 11.2 fL (ref 7.5–12.5)
Platelets: 295 Thousand/uL (ref 140–400)
RBC: 4.69 Million/uL (ref 3.80–5.10)
RDW: 14.1 % (ref 11.0–15.0)
WBC: 6.5 Thousand/uL (ref 3.8–10.8)

## 2020-12-21 LAB — SEDIMENTATION RATE: Sed Rate: 53 mm/h — ABNORMAL HIGH (ref 0–30)

## 2020-12-21 LAB — ANA: Anti Nuclear Antibody (ANA): NEGATIVE

## 2020-12-21 LAB — URIC ACID: Uric Acid, Serum: 7.6 mg/dL — ABNORMAL HIGH (ref 2.5–7.0)

## 2020-12-21 LAB — RHEUMATOID FACTOR: Rheumatoid fact SerPl-aCnc: <14 IU/mL (ref <14)

## 2020-12-21 LAB — HLA-B27 ANTIGEN: HLA-B27 Antigen: NEGATIVE

## 2021-01-08 ENCOUNTER — Ambulatory Visit: Payer: Medicaid Other | Admitting: Surgery

## 2021-01-14 ENCOUNTER — Encounter: Payer: Self-pay | Admitting: Surgery

## 2021-01-14 ENCOUNTER — Other Ambulatory Visit: Payer: Self-pay

## 2021-01-14 ENCOUNTER — Ambulatory Visit (INDEPENDENT_AMBULATORY_CARE_PROVIDER_SITE_OTHER): Payer: Medicaid Other | Admitting: Surgery

## 2021-01-14 DIAGNOSIS — M25551 Pain in right hip: Secondary | ICD-10-CM | POA: Diagnosis not present

## 2021-01-14 DIAGNOSIS — M25552 Pain in left hip: Secondary | ICD-10-CM

## 2021-01-14 NOTE — Progress Notes (Signed)
   Office Visit Note   Patient: April Mcgee           Date of Birth: 1960-07-20           MRN: [id number] Visit Date: 01/14/2021              Requested by: Dot Been, FNP [address] [place],  [place] [address] PCP: Dot Been, FNP   Assessment & Plan: Visit Diagnoses:  1. Pain in right hip   2. Pain in left hip     Plan: With patient's ongoing bilateral hip pain that is failed conservative treatment I recommend getting bilateral hip MRI scans to evaluate DJD and rule out greater trochanteric bursitis.  I will have patient follow-up with Dr. Ophelia Charter in 3 weeks for recheck and to discuss results and further treatment options.  Patient asked about getting another prednisone taper and I had my assistant at advised her to contact her primary care about this.  All questions answered.  Follow-Up Instructions: Return in about 3 weeks (around 02/04/2021) for with dr yates to review bilat hip MRI scans.   Orders:  Orders Placed This Encounter  Procedures   MR Hip Right w/o contrast   MR Hip Left w/o contrast   No orders of the defined types were placed in this encounter.     Procedures: No procedures performed   Clinical Data: No additional findings.   Subjective: Chief Complaint  Patient presents with   Left Hip - Pain, Follow-up   Right Hip - Pain, Follow-up    HPI 60 year old black female returns for recheck of bilateral hip pain and polyarthralgia.  CBC and arthritis panel drawn last office visit showed uric acid slightly elevated at 7.6 (range 2.5-7.0).  States that the prednisone taper that her PCP called in gave her good relief for about a week.  Eventually her hip pain returned. Review of Systems No current complaints of cardiac pulmonary GI GU issues  Objective: Vital Signs: There were no vitals taken for this visit.  Physical Exam HENT:     Head: Normocephalic and atraumatic.  Musculoskeletal:        General: Tenderness (Gait is somewhat  antalgic.  She has moderate to marked tenderness over the bilateral hip greater trochanter bursa.  Some pain bilateral hips with internal and external rotation.) present.  Neurological:     Mental Status: She is alert and oriented to person, place, and time.    Ortho Exam  Specialty Comments:  No specialty comments available.  Imaging: No results found.   PMFS History: There are no problems to display for this patient.  Past Medical History:  Diagnosis Date   Diabetes mellitus    Hyperlipidemia    Hypertension     No family history on file.  No past surgical history on file. Social History   Occupational History   Not on file  Tobacco Use   Smoking status: Every Day    Packs/day: 0.50    Types: Cigarettes   Smokeless tobacco: Never  Substance and Sexual Activity   Alcohol use: No   Drug use: No   Sexual activity: Not on file

## 2021-01-30 ENCOUNTER — Other Ambulatory Visit: Payer: Self-pay | Admitting: Radiology

## 2021-01-30 DIAGNOSIS — M25552 Pain in left hip: Secondary | ICD-10-CM

## 2021-01-30 DIAGNOSIS — M25551 Pain in right hip: Secondary | ICD-10-CM

## 2021-02-24 ENCOUNTER — Ambulatory Visit: Payer: Medicaid Other | Admitting: Physical Therapy

## 2021-03-02 ENCOUNTER — Ambulatory Visit: Payer: Medicaid Other | Admitting: Physical Therapy

## 2021-03-04 ENCOUNTER — Other Ambulatory Visit: Payer: Self-pay

## 2021-03-04 ENCOUNTER — Encounter: Payer: Self-pay | Admitting: Physical Therapy

## 2021-03-04 ENCOUNTER — Ambulatory Visit: Payer: Medicaid Other | Attending: Orthopaedic Surgery | Admitting: Physical Therapy

## 2021-03-04 DIAGNOSIS — M6281 Muscle weakness (generalized): Secondary | ICD-10-CM | POA: Insufficient documentation

## 2021-03-04 DIAGNOSIS — M62838 Other muscle spasm: Secondary | ICD-10-CM | POA: Diagnosis present

## 2021-03-04 DIAGNOSIS — M25551 Pain in right hip: Secondary | ICD-10-CM | POA: Insufficient documentation

## 2021-03-04 DIAGNOSIS — M25552 Pain in left hip: Secondary | ICD-10-CM | POA: Insufficient documentation

## 2021-03-04 NOTE — Therapy (Signed)
[facility] [address]  [address] [place], [place], [address] Phone: [phone]   Fax:  [phone]  Physical Therapy Evaluation  Patient Details  Name: April Mcgee MRN: [id number] Date of Birth: Jan 12, 1961 Referring Provider (PT): Annell Greening MD   Encounter Date: 03/04/2021   PT End of Session - 03/04/21 1429     Visit Number 1    Number of Visits 12    Date for PT Re-Evaluation 04/15/21    Authorization Type [place] [employer] prepaid health plan    PT Start Time 1429    PT Stop Time 1520    PT Time Calculation (min) 51 min    Activity Tolerance Patient tolerated treatment well    Behavior During Therapy [facility] for tasks assessed/performed             Past Medical History:  Diagnosis Date   Diabetes mellitus    Hyperlipidemia    Hypertension     History reviewed. No pertinent surgical history.  There were no vitals filed for this visit.    Subjective Assessment - 03/04/21 1435     Subjective The gabapentin is helping. It hurts iin the front and back of my hip when I try to lift my right leg. Sometimes left hip. It started 3-4 months insidious onset. No history of back pain.    Pertinent History DM, HTN, high cholesterol    How long can you sit comfortably? 30 minutes also hurts upoon standing    How long can you stand comfortably? 10 minutes limts cooking/cleaning    Diagnostic tests xrays: ild degenerative changes of hips and lumbar spine.    Patient Stated Goals to get rid of her pain    Currently in Pain? Yes    Pain Score 9     Pain Location Hip    Pain Orientation Right;Left    Pain Descriptors / Indicators Aching;Constant    Pain Type Acute pain    Pain Onset More than a month ago    Pain Frequency Constant    Aggravating Factors  lying on her hip, standing and sitting, sit to stand    Pain Relieving Factors moving around                [facility] PT Assessment - 03/04/21 0001       Assessment    Medical Diagnosis right hip pain; left hip pain    Referring Provider (PT) Annell Greening MD    Onset Date/Surgical Date 11/02/20    Hand Dominance Right    Prior Therapy no      Precautions   Precautions None      Restrictions   Weight Bearing Restrictions No      Balance Screen   Has the patient fallen in the past 6 months No    Has the patient had a decrease in activity level because of a fear of falling?  No    Is the patient reluctant to leave their home because of a fear of falling?  No      Home Environment   Living Environment Private residence    Living Arrangements Alone    Type of Home Apartment    Additional Comments stairs hurt and make her tired; uses step to gait; supposed to move to downstairs apt next month      Prior Function   Level of Independence Independent    Vocation Part time employment    Vocation Requirements up  and down; works at [employer]      Observation/Other Assessments   Focus on Therapeutic Outcomes (FOTO)  FS 43 (predicted 59)      Posture/Postural Control   Posture/Postural Control No significant limitations      ROM / Strength   AROM / PROM / Strength AROM;Strength      AROM   Overall AROM Comments Lumbar WNL; bil hips WFL      Strength   Overall Strength Comments Bil ankle DF 4+/5    Strength Assessment Site Hip;Knee    Right/Left Hip Right;Left    Right Hip Flexion 3+/5    Right Hip Extension 4+/5    Right Hip ABduction 4-/5    Right Hip ADduction 5/5    Left Hip Flexion 3+/5    Left Hip Extension 4-/5    Left Hip ABduction 4-/5    Left Hip ADduction 5/5    Right/Left Knee Right;Left    Right Knee Flexion 4/5    Right Knee Extension 4+/5    Left Knee Flexion 4-/5    Left Knee Extension 4+/5      Flexibility   Soft Tissue Assessment /Muscle Length yes    Hamstrings mild bil    Quadriceps mod bil    ITB +ober bil    Piriformis marked bil    Quadratus Lumborum bil hip flexors tight L>R    Obturator Internus bil  ADDuctors R>L      Palpation   Palpation comment marked tenderness with TPs throughout bil gluteals; tender along lateral quads and ITB and ADDuctors      Special Tests    Special Tests Hip Special Tests    Hip Special Tests  Luisa HartPatrick (FABER) Test;Ely's Test;Hip Scouring      Luisa HartPatrick (FABER) Test   Findings Positive    Comments Bil and FADIR      Ely's Test   Findings Positive    Comments L>R      Hip Scouring   Findings Positive    Side Right                        Objective measurements completed on examination: See above findings.                PT Education - 03/04/21 1515     Education Details HEP, POC    Person(s) Educated Patient    Methods Explanation;Demonstration;Handout    Comprehension Verbalized understanding;Returned demonstration              PT Short Term Goals - 03/04/21 1544       PT SHORT TERM GOAL #1   Title ind with initial HEP    Baseline issued 03/04/21    Time 2    Period Weeks    Status New               PT Long Term Goals - 03/04/21 1544       PT LONG TERM GOAL #1   Title Ind with advanced HEP for strength and flexibility    Baseline no HEP    Time 6    Period Weeks    Status New    Target Date 04/15/21      PT LONG TERM GOAL #2   Title Pt to report decreased pain in bil hips by 75% or more with cooking, standing and walking    Baseline bil hip pain 9/10 limiting cooking, standing and walking  Time 6    Period Weeks    Status New      PT LONG TERM GOAL #3   Title Patient able to demo 4+/5 Bil LE strength to allow safe reciprocal gait on stairs.    Baseline B LE 3+ to 4+/5 (see flowsheets) and must use step to gait on stairs    Time 6    Period Weeks    Status New      PT LONG TERM GOAL #4   Title Improved FS score to 56 showing improved function    Baseline FS = 43 (stage 2)    Time 6    Period Weeks    Status New      PT LONG TERM GOAL #5   Title Patient able to sleep on  either side without waking from pain    Baseline unable to sleep on right side; wakes her from sleep    Time 6    Period Weeks    Status New                    Plan - 03/04/21 1534     Clinical Impression Statement Patient presents today with c/o bil hip pain R>L x 3-4 months. Her pain limits walking, standing and sitting. This affects her ADLS including cooking, cleaning, and sit to stand transfers. Additionally she cannot lie on her right side. She has full lumbar and hip ROM but pain with hip ER, IR and ADD. She has postive FABER and FADIR bil and positive hip scour on the right. She has flexibility deficits in bil hip flexors, pirirformis, ITB, ADDuctors and hip rotators. She has marked tenderness and TPs throughout bil gluteals and hip ADDuctors. She also has Bil LE weakness and reports difficulty climbing stairs to her apartment; requiring a step to gait. She will benefit from skilled PT to address these deficits.    Examination-Activity Limitations Locomotion Level;Sit;Sleep;Stairs;Stand;Transfers    Examination-Participation Restrictions Cleaning;Meal Prep    Stability/Clinical Decision Making Stable/Uncomplicated    Clinical Decision Making Low    Rehab Potential Excellent    PT Frequency 2x / week    PT Duration 6 weeks    PT Treatment/Interventions ADLs/Self Care Home Management;Aquatic Therapy;Cryotherapy;Electrical Stimulation;Iontophoresis 4mg /ml Dexamethasone;Moist Heat;Therapeutic exercise;Therapeutic activities;Stair training;Patient/family education;Manual techniques;Dry needling;Taping;Joint Manipulations;Spinal Manipulations    PT Next Visit Plan Review HEP (please print as printer was down); progress flexibility; LE strength; MT/DN to bil gluteals/adductors/ITB    PT Home Exercise Plan [email or web address]    Consulted and Agree with Plan of Care Patient             Patient will benefit from skilled therapeutic intervention in order to improve the following deficits  and impairments:  Increased muscle spasms, Pain, Difficulty walking, Decreased activity tolerance, Impaired flexibility, Decreased strength  Visit Diagnosis: Pain in right hip  Pain in left hip  Muscle weakness (generalized)  Other muscle spasm     Problem List There are no problems to display for this patient.   , PT 03/04/2021, 4:01 PM  [facility] [address]  [address] [place], [place], [place] Phone: [phone]   Fax:  [phone]  Name: Nashalie Sallis MRN: Juline Patch Date of Birth: 1960-03-20

## 2021-03-04 NOTE — Patient Instructions (Signed)
Access Code: [email or web address] URL: [email or web address] Date: 03/04/2021 Prepared by: Raynelle Fanning  Exercises Modified Maisie Fus Stretch - 1 x daily - 7 x weekly - 1 sets - 3 reps - 30-60 sec hold Supine Piriformis Stretch with Foot on Ground - 2 x daily - 7 x weekly - 1 sets - 3 reps - 30-60 sec hold Supine Butterfly Groin Stretch - 2 x daily - 7 x weekly - 1 sets - 3 reps - 60 sec hold Supine Bridge - 2 x daily - 7 x weekly - 1-3 sets - 10 reps

## 2021-03-09 ENCOUNTER — Ambulatory Visit: Payer: Medicaid Other

## 2021-03-09 ENCOUNTER — Other Ambulatory Visit: Payer: Self-pay

## 2021-03-09 DIAGNOSIS — M6281 Muscle weakness (generalized): Secondary | ICD-10-CM

## 2021-03-09 DIAGNOSIS — M25551 Pain in right hip: Secondary | ICD-10-CM

## 2021-03-09 DIAGNOSIS — M25552 Pain in left hip: Secondary | ICD-10-CM

## 2021-03-09 DIAGNOSIS — M62838 Other muscle spasm: Secondary | ICD-10-CM

## 2021-03-09 NOTE — Therapy (Addendum)
PHYSICAL THERAPY DISCHARGE SUMMARY  Visits from Start of Care: 2  Current functional level related to goals / functional outcomes: Unknown. Patient has not returned to PT since 03/09/21 and has multiple no shows and late cancellations.    Remaining deficits: See above   Education / Equipment: Initial HEP  Patient goals were not met. Patient is being discharged due to attendance policy.  She has no-showed 4 visits and canceled 4 visits.     Rennie Natter, PT, DPT    [facility] [address]  [place], [place], [address] Phone: [phone]   Fax:  [phone]  Physical Therapy Treatment  Patient Details  Name: April Mcgee MRN: [id number] Date of Birth: Oct 02, 1960 Referring Provider (PT): Rodell Perna MD   Encounter Date: 03/09/2021   PT End of Session - 03/09/21 1532     Visit Number 2    Number of Visits 12    Date for PT Re-Evaluation 04/15/21    Authorization Type [place] [employer] prepaid health plan    PT Start Time 1447    PT Stop Time 1529    PT Time Calculation (min) 42 min    Activity Tolerance Patient tolerated treatment well;Patient limited by pain    Behavior During Therapy [facility] for tasks assessed/performed             Past Medical History:  Diagnosis Date   Diabetes mellitus    Hyperlipidemia    Hypertension     History reviewed. No pertinent surgical history.  There were no vitals filed for this visit.   Subjective Assessment - 03/09/21 1449     Subjective My hip is still hurting all the time.    Pertinent History DM, HTN, high cholesterol    Diagnostic tests xrays: ild degenerative changes of hips and lumbar spine.    Patient Stated Goals to get rid of her pain    Currently in Pain? Yes    Pain Score 8     Pain Location Hip    Pain Orientation Right;Left    Pain Descriptors / Indicators Aching;Constant    Pain Type Acute pain                                [facility] Adult PT Treatment/Exercise - 03/09/21 0001       Exercises   Exercises Knee/Hip      Knee/Hip Exercises: Stretches   Hip Flexor Stretch Right;2 reps;20 seconds    Hip Flexor Stretch Limitations thomas stretch    Other Knee/Hip Stretches R figure 4 stretch 2x30"    Other Knee/Hip Stretches R SKTC stretch 2x20"; butterfly stretch in supine 3x10"      Knee/Hip Exercises: Aerobic   Nustep L1x7mn      Knee/Hip Exercises: Supine   Other Supine Knee/Hip Exercises LTR 10x, cues to avoid pain      Manual Therapy   Manual Therapy Soft tissue mobilization;Myofascial release    Soft tissue mobilization STM to R quads, hip flexors, glutes    Myofascial Release to R VL, glute med, piriformis                       PT Short Term Goals - 03/09/21 1534       PT SHORT TERM GOAL #1   Title ind with initial HEP    Baseline issued 03/04/21    Time  2    Period Weeks    Status On-going               PT Long Term Goals - 03/09/21 1534       PT LONG TERM GOAL #1   Title Ind with advanced HEP for strength and flexibility    Baseline no HEP    Time 6    Period Weeks    Status On-going    Target Date 04/15/21      PT LONG TERM GOAL #2   Title Pt to report decreased pain in bil hips by 75% or more with cooking, standing and walking    Baseline bil hip pain 9/10 limiting cooking, standing and walking    Time 6    Period Weeks    Status On-going      PT LONG TERM GOAL #3   Title Patient able to demo 4+/5 Bil LE strength to allow safe reciprocal gait on stairs.    Baseline B LE 3+ to 4+/5 (see flowsheets) and must use step to gait on stairs    Time 6    Period Weeks    Status On-going      PT LONG TERM GOAL #4   Title Improved FS score to 56 showing improved function    Baseline FS = 43 (stage 2)    Time 6    Period Weeks    Status On-going      PT LONG TERM GOAL #5   Title Patient able to sleep on either side  without waking from pain    Baseline unable to sleep on right side; wakes her from sleep    Time 6    Period Weeks    Status On-going                   Plan - 03/09/21 1533     Clinical Impression Statement Pt today required cues and instructions with stretches to avoid pain. Had some c/o of LBP with trunk rotations but with duration of exercise it subsided. Soft tissue restriction found in the glutes, and anterior thigh. Her pain was still high today so focused on MT and stretches. She would benefit from some DN.    PT Frequency 2x / week    PT Duration 6 weeks    PT Treatment/Interventions ADLs/Self Care Home Management;Cryotherapy;Electrical Stimulation;Iontophoresis 65m/ml Dexamethasone;Moist Heat;Therapeutic exercise;Therapeutic activities;Stair training;Patient/family education;Manual techniques;Dry needling;Taping;Joint Manipulations;Spinal Manipulations    PT Next Visit Plan progress flexibility; LE strength; MT/DN to bil gluteals/adductors/ITB    PT Home Exercise Plan [email or web address]    Consulted and Agree with Plan of Care Patient             Patient will benefit from skilled therapeutic intervention in order to improve the following deficits and impairments:  Increased muscle spasms, Pain, Difficulty walking, Decreased activity tolerance, Impaired flexibility, Decreased strength  Visit Diagnosis: Pain in right hip  Pain in left hip  Muscle weakness (generalized)  Other muscle spasm     Problem List There are no problems to display for this patient.   BArtist Pais PTA 03/09/2021, 3:45 PM  [facility] [place] [address]Phone: [phone]  Fax:  [phone] Name: April RowzeeMRN: [id number] of Birth: [birth year]

## 2021-03-12 ENCOUNTER — Ambulatory Visit: Payer: Medicaid Other | Attending: Orthopaedic Surgery

## 2021-03-16 ENCOUNTER — Ambulatory Visit: Payer: Medicaid Other | Admitting: Physical Therapy

## 2021-03-19 ENCOUNTER — Ambulatory Visit: Payer: Medicaid Other | Admitting: Physical Therapy

## 2021-03-23 ENCOUNTER — Ambulatory Visit: Payer: Medicaid Other

## 2021-03-26 ENCOUNTER — Ambulatory Visit: Payer: Medicaid Other

## 2021-03-30 ENCOUNTER — Ambulatory Visit: Payer: Medicaid Other

## 2021-04-02 ENCOUNTER — Ambulatory Visit: Payer: Medicaid Other

## 2021-04-06 ENCOUNTER — Ambulatory Visit: Payer: Medicaid Other | Admitting: Physical Therapy

## 2021-07-10 ENCOUNTER — Other Ambulatory Visit: Payer: Self-pay | Admitting: Family Medicine

## 2021-07-10 DIAGNOSIS — Z1231 Encounter for screening mammogram for malignant neoplasm of breast: Secondary | ICD-10-CM

## 2021-08-17 ENCOUNTER — Ambulatory Visit
Admission: RE | Admit: 2021-08-17 | Discharge: 2021-08-17 | Disposition: A | Discharge status: Home / Self Care | Payer: Medicaid Other | Source: Ambulatory Visit | Attending: Family Medicine | Admitting: Family Medicine

## 2021-08-17 DIAGNOSIS — Z1231 Encounter for screening mammogram for malignant neoplasm of breast: Secondary | ICD-10-CM

## 2021-09-15 ENCOUNTER — Ambulatory Visit (INDEPENDENT_AMBULATORY_CARE_PROVIDER_SITE_OTHER): Payer: Medicaid Other | Admitting: Surgery

## 2021-09-15 ENCOUNTER — Ambulatory Visit: Payer: Self-pay

## 2021-09-15 DIAGNOSIS — M25512 Pain in left shoulder: Secondary | ICD-10-CM

## 2021-09-15 DIAGNOSIS — M7542 Impingement syndrome of left shoulder: Secondary | ICD-10-CM | POA: Diagnosis not present

## 2021-09-15 NOTE — Progress Notes (Signed)
Office Visit Note   Patient: April Mcgee           Date of Birth: [birth year]           MRN: [id number] Visit Date: 09/15/2021              Requested by: Dot Been, FNP [address] [place],  [place] [address] PCP: Dot Been, FNP   Assessment & Plan: Visit Diagnoses:  1. Left shoulder pain, unspecified chronicity   2. Impingement syndrome of left shoulder     Plan: In hopes of giving patient improvement of her shoulder pain offered injection.  Patient consent left shoulder was prepped with Betadine and subacromial Marcaine/Depo-Medrol injection was performed.  Tolerated without complication.  CBG in clinic today 135 and patient was advised to monitor her blood sugars.  Follow-up in 4 weeks for recheck.  If she does not have any improvement may consider getting an MRI to rule out rotator cuff tear.  Follow-Up Instructions: Return in about 4 weeks (around 10/13/2021) for with Koral Thaden recheck left shoulder.   Orders:  Orders Placed This Encounter  Procedures   Large Joint Inj   XR Shoulder Left   No orders of the defined types were placed in this encounter.     Procedures: Large Joint Inj: L subacromial bursa on 09/15/2021 2:19 PM Indications: pain Details: 25 G 1.5 in needle, posterior approach Medications: 3 mL lidocaine 1 %; 5 mL bupivacaine 0.5 %; 40 mg methylPREDNISolone acetate 40 MG/ML Outcome: tolerated well, no immediate complications Consent was given by the patient. Patient was prepped and draped in the usual sterile fashion.      Clinical Data: No additional findings.   Subjective: Chief Complaint  Patient presents with   Left Shoulder - Pain    HPI 61 year old black female comes in today with complaints of left shoulder pain.  States that pain ongoing about 1 to 2 months.  No injury.  No previous problems for onset.  Pain aggravated with overactivity, and internal rotation behind her back.  Also awakens her at night when she lays on her left  shoulder.  Denies cervical spine or radicular component.  Patient does have history of diabetes. Review of Systems No current complaints of cardiopulmonary GI/GU issue  Objective: Vital Signs: There were no vitals taken for this visit.  Physical Exam HENT:     Head: Normocephalic and atraumatic.     Nose: Nose normal.  Eyes:     Extraocular Movements: Extraocular movements intact.  Pulmonary:     Effort: Pulmonary effort is normal. No respiratory distress.  Musculoskeletal:     Comments: Cervical spine unremarkable.  Left shoulder shows limitation in range of motion with flexion/abduction and internal rotation on her back with pain.  Positive impingement test.  Negative drop arm.  Pain with supraspinatus resistance.  Right shoulder unremarkable.  Neurological:     Mental Status: She is alert and oriented to person, place, and time.  Psychiatric:        Mood and Affect: Mood normal.    Ortho Exam  Specialty Comments:  No specialty comments available.  Imaging: No results found.   PMFS History: There are no problems to display for this patient.  Past Medical History:  Diagnosis Date   Diabetes mellitus    Hyperlipidemia    Hypertension     No family history on file.  No past surgical history on file. Social History   Occupational History   Not  on file  Tobacco Use   Smoking status: Every Day    Packs/day: 0.50    Types: Cigarettes   Smokeless tobacco: Never  Substance and Sexual Activity   Alcohol use: No   Drug use: No   Sexual activity: Not on file

## 2021-10-13 ENCOUNTER — Encounter: Payer: Self-pay | Admitting: Surgery

## 2021-10-13 MED ORDER — BUPIVACAINE HCL 0.5 % IJ SOLN
5.0000 mL | INTRAMUSCULAR | Status: AC | PRN
Start: 2021-09-15 — End: 2021-09-15
  Administered 2021-09-15: 5 mL via INTRA_ARTICULAR

## 2021-10-13 MED ORDER — METHYLPREDNISOLONE ACETATE 40 MG/ML IJ SUSP
40.0000 mg | INTRAMUSCULAR | Status: AC | PRN
Start: 2021-09-15 — End: 2021-09-15
  Administered 2021-09-15: 40 mg via INTRA_ARTICULAR

## 2021-10-13 MED ORDER — LIDOCAINE HCL 1 % IJ SOLN
3.0000 mL | INTRAMUSCULAR | Status: AC | PRN
  Administered 2021-09-15: 3 mL

## 2021-10-15 ENCOUNTER — Ambulatory Visit: Payer: Medicaid Other | Admitting: Surgery

## 2022-02-15 ENCOUNTER — Ambulatory Visit (INDEPENDENT_AMBULATORY_CARE_PROVIDER_SITE_OTHER): Payer: Commercial Managed Care - HMO | Admitting: Physician Assistant

## 2022-02-15 DIAGNOSIS — M25512 Pain in left shoulder: Secondary | ICD-10-CM

## 2022-02-15 MED ORDER — BUPIVACAINE HCL 0.25 % IJ SOLN
2.0000 mL | INTRAMUSCULAR | Status: AC | PRN
  Administered 2022-02-15: 2 mL via INTRA_ARTICULAR

## 2022-02-15 MED ORDER — METHYLPREDNISOLONE ACETATE 40 MG/ML IJ SUSP
80.0000 mg | INTRAMUSCULAR | Status: AC | PRN
  Administered 2022-02-15: 80 mg via INTRA_ARTICULAR

## 2022-02-15 MED ORDER — LIDOCAINE HCL 1 % IJ SOLN
2.0000 mL | INTRAMUSCULAR | Status: AC | PRN
  Administered 2022-02-15: 2 mL

## 2022-02-15 NOTE — Progress Notes (Signed)
Office Visit Note   Patient: April Mcgee           Date of Birth: 10-17-1960           MRN: [id number] Visit Date: 02/15/2022              Requested by: Dot Been, FNP [address] [place],  [place] [address] PCP: Dot Been, FNP  Chief Complaint  Patient presents with  . Left Shoulder - Pain      HPI: April Mcgee is a pleasant 62 year old woman with a history of left shoulder impingement.  She was last given an injection into her left shoulder by Fayrene Fearing in August.  She did very well and now has the return of her painful symptoms.  She thinks this was aggravated at work where she was lifting lots of boxes.  No other injuries  Assessment & Plan: Visit Diagnoses:  1. Left shoulder pain, unspecified chronicity     Plan: Will go forward with an injection today.  If she only gets short-term relief or no relief at all I recommend an MRI.  She may call me if this is necessary  Follow-Up Instructions: Return if symptoms worsen or fail to improve.   Ortho Exam  Patient is alert, oriented, no adenopathy, well-dressed, normal affect, normal respiratory effort. Left shoulder no redness no erythema no deformity.  She has full forward elevation though painful in the extremes.  Her strength is intact she has a negative drop arm test.  She has a positive empty can test and positive impingement findings she is neurovascular intact  Imaging: No results found. No images are attached to the encounter.  Labs: Lab Results  Component Value Date   HGBA1C 14.1 (H) 11/04/2009   ESRSEDRATE 53 (H) 12/18/2020   LABURIC 7.6 (H) 12/18/2020   REPTSTATUS 09/26/2018 FINAL 09/24/2018   CULT MULTIPLE SPECIES PRESENT, SUGGEST RECOLLECTION (A) 09/24/2018     Lab Results  Component Value Date   ALBUMIN 2.9 (L) 03/22/2019   ALBUMIN 3.7 09/24/2018   ALBUMIN 3.6 02/22/2014    No results found for: "MG" No results found for: "VD25OH"  No results found for: "PREALBUMIN"    Latest Ref Rng  & Units 12/18/2020    3:19 PM 03/22/2019    1:14 PM 03/18/2019    2:53 PM  CBC EXTENDED  WBC 3.8 - 10.8 Thousand/uL 6.5  3.3  3.3   RBC 3.80 - 5.10 Million/uL 4.69  4.89  5.18   Hemoglobin 11.7 - 15.5 g/dL 24.0  97.3  53.2   HCT 35.0 - 45.0 % 40.1  42.6  45.7   Platelets 140 - 400 Thousand/uL 295  154  209      There is no height or weight on file to calculate BMI.  Orders:  No orders of the defined types were placed in this encounter.  No orders of the defined types were placed in this encounter.    Procedures: Large Joint Inj: L subacromial bursa on 02/15/2022 1:26 PM Indications: diagnostic evaluation and pain Details: 25 G 1.5 in needle, posterior approach  Arthrogram: No  Medications: 2 mL lidocaine 1 %; 80 mg methylPREDNISolone acetate 40 MG/ML; 2 mL bupivacaine 0.25 % Outcome: tolerated well, no immediate complications Procedure, treatment alternatives, risks and benefits explained, specific risks discussed. Consent was given by the patient.    Clinical Data: No additional findings.  ROS:  All other systems negative, except as noted in the HPI. Review of Systems  Objective: Vital Signs: There were no vitals taken for this visit.  Specialty Comments:  No specialty comments available.  PMFS History: There are no problems to display for this patient.  Past Medical History:  Diagnosis Date  . Diabetes mellitus   . Hyperlipidemia   . Hypertension     No family history on file.  No past surgical history on file. Social History   Occupational History  . Not on file  Tobacco Use  . Smoking status: Every Day    Packs/day: 0.50    Types: Cigarettes  . Smokeless tobacco: Never  Substance and Sexual Activity  . Alcohol use: No  . Drug use: No  . Sexual activity: Not on file

## 2022-05-27 ENCOUNTER — Emergency Department (HOSPITAL_COMMUNITY): Payer: Medicaid Other

## 2022-05-27 ENCOUNTER — Emergency Department (HOSPITAL_COMMUNITY)
Admission: EM | Admit: 2022-05-27 | Discharge: 2022-05-27 | Disposition: A | Discharge status: Home / Self Care | Payer: Medicaid Other | Attending: Emergency Medicine | Admitting: Emergency Medicine

## 2022-05-27 ENCOUNTER — Other Ambulatory Visit: Payer: Self-pay

## 2022-05-27 ENCOUNTER — Encounter (HOSPITAL_COMMUNITY): Payer: Self-pay | Admitting: Emergency Medicine

## 2022-05-27 DIAGNOSIS — R1010 Upper abdominal pain, unspecified: Secondary | ICD-10-CM | POA: Diagnosis present

## 2022-05-27 DIAGNOSIS — R112 Nausea with vomiting, unspecified: Secondary | ICD-10-CM | POA: Diagnosis not present

## 2022-05-27 DIAGNOSIS — R1013 Epigastric pain: Secondary | ICD-10-CM | POA: Insufficient documentation

## 2022-05-27 DIAGNOSIS — Z79899 Other long term (current) drug therapy: Secondary | ICD-10-CM | POA: Insufficient documentation

## 2022-05-27 DIAGNOSIS — R1011 Right upper quadrant pain: Secondary | ICD-10-CM | POA: Diagnosis not present

## 2022-05-27 DIAGNOSIS — I10 Essential (primary) hypertension: Secondary | ICD-10-CM | POA: Diagnosis not present

## 2022-05-27 DIAGNOSIS — D72829 Elevated white blood cell count, unspecified: Secondary | ICD-10-CM | POA: Diagnosis not present

## 2022-05-27 DIAGNOSIS — Z794 Long term (current) use of insulin: Secondary | ICD-10-CM | POA: Insufficient documentation

## 2022-05-27 DIAGNOSIS — E1165 Type 2 diabetes mellitus with hyperglycemia: Secondary | ICD-10-CM | POA: Diagnosis not present

## 2022-05-27 LAB — CBC WITH DIFFERENTIAL/PLATELET
Abs Immature Granulocytes: 0.09 10*3/uL — ABNORMAL HIGH (ref 0.00–0.07)
Basophils Absolute: 0 10*3/uL (ref 0.0–0.1)
Basophils Relative: 0 %
Eosinophils Absolute: 0 10*3/uL (ref 0.0–0.5)
Eosinophils Relative: 0 %
HCT: 45.4 % (ref 36.0–46.0)
Hemoglobin: 14.3 g/dL (ref 12.0–15.0)
Immature Granulocytes: 1 %
Lymphocytes Relative: 6 %
Lymphs Abs: 0.8 10*3/uL (ref 0.7–4.0)
MCH: 27.3 pg (ref 26.0–34.0)
MCHC: 31.5 g/dL (ref 30.0–36.0)
MCV: 86.8 fL (ref 80.0–100.0)
Monocytes Absolute: 0.7 10*3/uL (ref 0.1–1.0)
Monocytes Relative: 5 %
Neutro Abs: 12 10*3/uL — ABNORMAL HIGH (ref 1.7–7.7)
Neutrophils Relative %: 88 %
Platelets: 240 10*3/uL (ref 150–400)
RBC: 5.23 MIL/uL — ABNORMAL HIGH (ref 3.87–5.11)
RDW: 13.8 % (ref 11.5–15.5)
WBC: 13.7 10*3/uL — ABNORMAL HIGH (ref 4.0–10.5)
nRBC: 0 % (ref 0.0–0.2)

## 2022-05-27 LAB — COMPREHENSIVE METABOLIC PANEL
ALT: 26 U/L (ref 0–44)
AST: 24 U/L (ref 15–41)
Albumin: 3.8 g/dL (ref 3.5–5.0)
Alkaline Phosphatase: 128 U/L — ABNORMAL HIGH (ref 38–126)
Anion gap: 15 (ref 5–15)
BUN: 33 mg/dL — ABNORMAL HIGH (ref 8–23)
CO2: 18 mmol/L — ABNORMAL LOW (ref 22–32)
Calcium: 10.2 mg/dL (ref 8.9–10.3)
Chloride: 103 mmol/L (ref 98–111)
Creatinine, Ser: 1.74 mg/dL — ABNORMAL HIGH (ref 0.44–1.00)
GFR, Estimated: 33 mL/min — ABNORMAL LOW (ref >60)
Glucose, Bld: 442 mg/dL — ABNORMAL HIGH (ref 70–99)
Potassium: 5.2 mmol/L — ABNORMAL HIGH (ref 3.5–5.1)
Sodium: 136 mmol/L (ref 135–145)
Total Bilirubin: 1 mg/dL (ref 0.3–1.2)
Total Protein: 8.1 g/dL (ref 6.5–8.1)

## 2022-05-27 LAB — URINALYSIS, W/ REFLEX TO CULTURE (INFECTION SUSPECTED)
Bilirubin Urine: NEGATIVE
Glucose, UA: >=500 mg/dL — AB
Hgb urine dipstick: NEGATIVE
Ketones, ur: 5 mg/dL — AB
Leukocytes,Ua: NEGATIVE
Nitrite: NEGATIVE
Protein, ur: 100 mg/dL — AB
Specific Gravity, Urine: 1.018 (ref 1.005–1.030)
pH: 5 (ref 5.0–8.0)

## 2022-05-27 LAB — LIPASE, BLOOD: Lipase: 45 U/L (ref 11–51)

## 2022-05-27 MED ORDER — MORPHINE SULFATE (PF) 4 MG/ML IV SOLN
4.0000 mg | Freq: Once | INTRAVENOUS | Status: AC
  Administered 2022-05-27: 4 mg via INTRAVENOUS
  Filled 2022-05-27: qty 1

## 2022-05-27 MED ORDER — ONDANSETRON HCL 4 MG/2ML IJ SOLN
4.0000 mg | Freq: Once | INTRAMUSCULAR | Status: AC
  Administered 2022-05-27: 4 mg via INTRAVENOUS
  Filled 2022-05-27: qty 2

## 2022-05-27 MED ORDER — PANTOPRAZOLE SODIUM 40 MG IV SOLR
40.0000 mg | Freq: Once | INTRAVENOUS | Status: AC
  Administered 2022-05-27: 40 mg via INTRAVENOUS
  Filled 2022-05-27: qty 10

## 2022-05-27 MED ORDER — LACTATED RINGERS IV BOLUS
1000.0000 mL | Freq: Once | INTRAVENOUS | Status: AC
  Administered 2022-05-27: 1000 mL via INTRAVENOUS

## 2022-05-27 NOTE — ED Provider Notes (Signed)
[place] AT [facility] Provider Note   CSN: [id number] Arrival date & time: 05/27/22  0945     History  Chief Complaint  Patient presents with   Abdominal Pain    April Mcgee is a 62 y.o. female.  Patient is a 62 year old female with a history of diabetes, hypertension, hyperlipidemia, tobacco and marijuana use who is presenting today with sudden onset of abdominal pain.  Patient reports waking up around 5 or 6 this morning with upper abdominal pain and repetitive vomiting.  She has vomited about 7 times since waking up.  Denies any blood in her emesis.  Last bowel movement was 3 days ago reports she is unable to go to the bathroom today.  Denies any prior abdominal surgeries.  No prior history of similar symptoms.  She reports normally she does suffer with acid reflux but if she has abdominal pain she will take Pepto-Bismol have a bowel movement and feels fine.  Today that has not been the case.  Yesterday was a normal day.  She denies any fever cough or respiratory symptoms.  The history is provided by the patient and medical records.  Abdominal Pain      Home Medications Prior to Admission medications   Medication Sig Start Date End Date Taking? Authorizing Provider  acetaminophen (TYLENOL) 650 MG CR tablet Take 650 mg by mouth every 8 (eight) hours as needed for pain.    [provider]  amLODipine (NORVASC) 10 MG tablet Take 1 tablet (10 mg total) by mouth daily. 03/19/19   Benjiman Core, MD  cephALEXin (KEFLEX) 500 MG capsule Take 1 capsule (500 mg total) by mouth 4 (four) times daily. 09/25/18   Mesner, Barbara Cower, MD  diclofenac (VOLTAREN) 75 MG EC tablet Take 75 mg by mouth 2 (two) times daily.    [provider]  gabapentin (NEURONTIN) 600 MG tablet Take 600 mg by mouth 3 (three) times daily.    [provider]  ibuprofen (ADVIL,MOTRIN) 600 MG tablet Take 600 mg by mouth every 12 (twelve) hours as needed for mild  pain or moderate pain.    [provider]  insulin aspart protamine- aspart (NOVOLOG MIX 70/30) (70-30) 100 UNIT/ML injection 30 units with breakfast and 50 units with dinner. 03/19/19   Benjiman Core, MD  Melatonin 3 MG TABS Take 1 tablet (3 mg total) by mouth at bedtime. 03/22/19   Hunter, [place], MD  naproxen (NAPROSYN) 250 MG tablet Take 1 tablet (250 mg total) by mouth 2 (two) times daily with a meal. 03/22/19   Hunter, [place], MD  ondansetron (ZOFRAN) 4 MG tablet Take 1 tablet (4 mg total) by mouth every 6 (six) hours. 12/17/11   Nelva Nay, MD  orphenadrine (NORFLEX) 100 MG tablet Take 1 tablet (100 mg total) by mouth 2 (two) times daily. 12/20/15   Dione Booze, MD  pantoprazole (PROTONIX) 40 MG tablet Take 1 tablet (40 mg total) by mouth daily. 03/19/19   Benjiman Core, MD  promethazine (PHENERGAN) 25 MG tablet Take 1 tablet (25 mg total) by mouth every 6 (six) hours as needed for nausea. 02/22/14   Benjiman Core, MD      Allergies    Patient has no known allergies.    Review of Systems   Review of Systems  Gastrointestinal:  Positive for abdominal pain.    Physical Exam Updated Vital Signs BP (!) 146/70 (BP Location: Left Arm)   Pulse 87   Temp 97.8 F (36.6 C) (  Oral)   Resp 16   Ht 5' 5.5" (1.664 m)   Wt 68 kg   SpO2 94%   BMI 24.58 kg/m  Physical Exam Vitals and nursing note reviewed.  Constitutional:      General: She is not in acute distress.    Appearance: She is well-developed.  HENT:     Head: Normocephalic and atraumatic.  Eyes:     Pupils: Pupils are equal, round, and reactive to light.  Cardiovascular:     Rate and Rhythm: Normal rate and regular rhythm.     Heart sounds: Normal heart sounds. No murmur heard.    No friction rub.  Pulmonary:     Effort: Pulmonary effort is normal.     Breath sounds: Normal breath sounds. No wheezing or rales.  Abdominal:     General: Bowel sounds are decreased. There is distension.     Palpations:  Abdomen is soft.     Tenderness: There is abdominal tenderness in the right upper quadrant and epigastric area. There is no guarding or rebound.  Musculoskeletal:        General: No tenderness. Normal range of motion.     Right lower leg: No edema.     Left lower leg: No edema.     Comments: No edema  Skin:    General: Skin is warm and dry.     Findings: No rash.  Neurological:     Mental Status: She is alert and oriented to person, place, and time. Mental status is at baseline.     Cranial Nerves: No cranial nerve deficit.  Psychiatric:        Behavior: Behavior normal.     ED Results / Procedures / Treatments   Labs (all labs ordered are listed, but only abnormal results are displayed) Labs Reviewed  CBC WITH DIFFERENTIAL/PLATELET - Abnormal; Notable for the following components:      Result Value   WBC 13.7 (*)    RBC 5.23 (*)    Neutro Abs 12.0 (*)    Abs Immature Granulocytes 0.09 (*)    All other components within normal limits  COMPREHENSIVE METABOLIC PANEL - Abnormal; Notable for the following components:   Potassium 5.2 (*)    CO2 18 (*)    Glucose, Bld 442 (*)    BUN 33 (*)    Creatinine, Ser 1.74 (*)    Alkaline Phosphatase 128 (*)    GFR, Estimated 33 (*)    All other components within normal limits  URINALYSIS, W/ REFLEX TO CULTURE (INFECTION SUSPECTED) - Abnormal; Notable for the following components:   Glucose, UA >=500 (*)    Ketones, ur 5 (*)    Protein, ur 100 (*)    Bacteria, UA RARE (*)    All other components within normal limits  LIPASE, BLOOD    EKG None  Radiology CT ABDOMEN PELVIS WO CONTRAST  Result Date: 05/27/2022 CLINICAL DATA:  Acute generalized abdominal pain. EXAM: CT ABDOMEN AND PELVIS WITHOUT CONTRAST TECHNIQUE: Multidetector CT imaging of the abdomen and pelvis was performed following the standard protocol without IV contrast. RADIATION DOSE REDUCTION: This exam was performed according to the departmental dose-optimization  program which includes automated exposure control, adjustment of the mA and/or kV according to patient size and/or use of iterative reconstruction technique. COMPARISON:  November 27, 2015. FINDINGS: Lower chest: No acute abnormality. Hepatobiliary: No focal liver abnormality is seen. No gallstones, gallbladder wall thickening, or biliary dilatation. Pancreas: Unremarkable. No pancreatic ductal dilatation or  surrounding inflammatory changes. Spleen: Normal in size without focal abnormality. Adrenals/Urinary Tract: Adrenal glands are unremarkable. Kidneys are normal, without renal calculi, focal lesion, or hydronephrosis. Bladder is unremarkable. Stomach/Bowel: Stomach is within normal limits. Appendix appears normal. No evidence of bowel wall thickening, distention, or inflammatory changes. Vascular/Lymphatic: No significant vascular findings are present. No enlarged abdominal or pelvic lymph nodes. Reproductive: Uterus and bilateral adnexa are unremarkable. Other: No abdominal wall hernia or abnormality. No abdominopelvic ascites. Musculoskeletal: No acute or significant osseous findings. IMPRESSION: No definite abnormality seen in the abdomen or pelvis. Electronically Signed   By: Lupita Raider M.D.   On: 05/27/2022 14:22    Procedures Procedures    Medications Ordered in ED Medications  ondansetron (ZOFRAN) injection 4 mg (4 mg Intravenous Given 05/27/22 1207)  lactated ringers bolus 1,000 mL (1,000 mLs Intravenous New Bag/Given 05/27/22 1145)  pantoprazole (PROTONIX) injection 40 mg (40 mg Intravenous Given 05/27/22 1207)  morphine (PF) 4 MG/ML injection 4 mg (4 mg Intravenous Given 05/27/22 1231)    ED Course/ Medical Decision Making/ A&P                             Medical Decision Making Amount and/or Complexity of Data Reviewed Labs: ordered. Decision-making details documented in ED Course. Radiology: ordered and independent interpretation performed. Decision-making details documented in ED  Course.  Risk Prescription drug management.   Pt with multiple medical problems and comorbidities and presenting today with a complaint that caries a high risk for morbidity and mortality.  Here today complaining of abdominal pain and vomiting.  Concern for gastritis, obstruction, pancreatitis, cholecystitis.  Patient has no lower abdominal pain concerning for urinary pathology, kidney stones, appendicitis or diverticulitis.  Patient given medications for discomfort.  She denies any alcohol use.  Labs and imaging pending.  2:46 PM I independently interpreted patient's labs and CBC with mild leukocytosis today of 13.7, stable hemoglobin, CMP with creatinine of 1.74 which appears to be patient's baseline, potassium of 5.2 but hemolysis is present and hyperglycemia with a blood sugar of 442, lipase within normal limits, UA without evidence of infection today.  I have independently visualized and interpreted pt's images today.  CT today is negative obstruction, renal stone or hydronephrosis.  Radiology reports negative.  On repeat evaluation patient is feeling better.  She has had no further vomiting here.  Will p.o. challenge.  3:23 PM No further vomiting pt stable for d/c.  She has antiemetics at home to use prn.          Final Clinical Impression(s) / ED Diagnoses Final diagnoses:  Pain of upper abdomen  Nausea and vomiting, unspecified vomiting type    Rx / DC Orders ED Discharge Orders     None         Gwyneth Sprout, MD 05/27/22 1523

## 2022-05-27 NOTE — Discharge Instructions (Signed)
Your blood work and your scans today look okay.  This may have happened because of a virus or possibly because you had not gone to the bathroom and it was causing bowel cramps.  Use your nausea medication at home as needed.  Take a over-the-counter stool softener or laxative such as MiraLAX to help you have a bowel movement.  Return if you start having fever, cannot hold anything down or abdominal pain returns and becomes severe.

## 2022-05-27 NOTE — ED Triage Notes (Signed)
Patient c/o abdominal pain with N/V onset of 5am today. Reports her urine is "hot."

## 2022-05-27 NOTE — ED Notes (Signed)
Pt given ice water and diet ginger ale

## 2022-06-10 DIAGNOSIS — E1122 Type 2 diabetes mellitus with diabetic chronic kidney disease: Secondary | ICD-10-CM | POA: Insufficient documentation

## 2022-07-13 ENCOUNTER — Other Ambulatory Visit: Payer: Self-pay | Admitting: Physician Assistant

## 2022-07-13 DIAGNOSIS — Z1231 Encounter for screening mammogram for malignant neoplasm of breast: Secondary | ICD-10-CM

## 2022-07-23 DIAGNOSIS — I10 Essential (primary) hypertension: Secondary | ICD-10-CM | POA: Diagnosis not present

## 2022-07-23 DIAGNOSIS — E782 Mixed hyperlipidemia: Secondary | ICD-10-CM | POA: Diagnosis not present

## 2022-07-23 DIAGNOSIS — E1142 Type 2 diabetes mellitus with diabetic polyneuropathy: Secondary | ICD-10-CM | POA: Diagnosis not present

## 2022-08-23 ENCOUNTER — Ambulatory Visit: Payer: 59

## 2022-08-24 DIAGNOSIS — I10 Essential (primary) hypertension: Secondary | ICD-10-CM | POA: Diagnosis not present

## 2022-08-24 DIAGNOSIS — E1142 Type 2 diabetes mellitus with diabetic polyneuropathy: Secondary | ICD-10-CM | POA: Diagnosis not present

## 2022-08-24 DIAGNOSIS — Z794 Long term (current) use of insulin: Secondary | ICD-10-CM | POA: Diagnosis not present

## 2022-08-24 DIAGNOSIS — E782 Mixed hyperlipidemia: Secondary | ICD-10-CM | POA: Diagnosis not present

## 2022-08-24 DIAGNOSIS — Z1212 Encounter for screening for malignant neoplasm of rectum: Secondary | ICD-10-CM | POA: Diagnosis not present

## 2022-08-24 DIAGNOSIS — Z716 Tobacco abuse counseling: Secondary | ICD-10-CM | POA: Diagnosis not present

## 2022-08-24 DIAGNOSIS — Z1211 Encounter for screening for malignant neoplasm of colon: Secondary | ICD-10-CM | POA: Diagnosis not present

## 2022-08-24 DIAGNOSIS — Z720 Tobacco use: Secondary | ICD-10-CM | POA: Diagnosis not present

## 2022-09-01 DIAGNOSIS — H2511 Age-related nuclear cataract, right eye: Secondary | ICD-10-CM | POA: Diagnosis not present

## 2022-09-17 ENCOUNTER — Ambulatory Visit
Admission: RE | Admit: 2022-09-17 | Discharge: 2022-09-17 | Disposition: A | Discharge status: Home / Self Care | Payer: MEDICAID | Source: Ambulatory Visit | Attending: Physician Assistant | Admitting: Physician Assistant

## 2022-09-17 DIAGNOSIS — Z1231 Encounter for screening mammogram for malignant neoplasm of breast: Secondary | ICD-10-CM

## 2022-09-23 DIAGNOSIS — E1142 Type 2 diabetes mellitus with diabetic polyneuropathy: Secondary | ICD-10-CM | POA: Diagnosis not present

## 2022-09-23 DIAGNOSIS — Z6832 Body mass index (BMI) 32.0-32.9, adult: Secondary | ICD-10-CM | POA: Diagnosis not present

## 2022-09-23 DIAGNOSIS — Z794 Long term (current) use of insulin: Secondary | ICD-10-CM | POA: Diagnosis not present

## 2022-09-23 DIAGNOSIS — K219 Gastro-esophageal reflux disease without esophagitis: Secondary | ICD-10-CM | POA: Diagnosis not present

## 2022-09-23 DIAGNOSIS — Z720 Tobacco use: Secondary | ICD-10-CM | POA: Diagnosis not present

## 2022-09-23 DIAGNOSIS — Z8249 Family history of ischemic heart disease and other diseases of the circulatory system: Secondary | ICD-10-CM | POA: Diagnosis not present

## 2022-09-23 DIAGNOSIS — M62838 Other muscle spasm: Secondary | ICD-10-CM | POA: Diagnosis not present

## 2022-09-23 DIAGNOSIS — Z7985 Long-term (current) use of injectable non-insulin antidiabetic drugs: Secondary | ICD-10-CM | POA: Diagnosis not present

## 2022-09-23 DIAGNOSIS — E785 Hyperlipidemia, unspecified: Secondary | ICD-10-CM | POA: Diagnosis not present

## 2022-09-23 DIAGNOSIS — J309 Allergic rhinitis, unspecified: Secondary | ICD-10-CM | POA: Diagnosis not present

## 2022-09-23 DIAGNOSIS — Z833 Family history of diabetes mellitus: Secondary | ICD-10-CM | POA: Diagnosis not present

## 2022-09-23 DIAGNOSIS — I10 Essential (primary) hypertension: Secondary | ICD-10-CM | POA: Diagnosis not present

## 2022-10-07 ENCOUNTER — Other Ambulatory Visit: Payer: Self-pay

## 2022-10-07 ENCOUNTER — Encounter (HOSPITAL_COMMUNITY): Payer: Self-pay

## 2022-10-07 ENCOUNTER — Emergency Department (HOSPITAL_COMMUNITY)
Admission: EM | Admit: 2022-10-07 | Discharge: 2022-10-07 | Disposition: A | Discharge status: Home / Self Care | Payer: 59 | Attending: Emergency Medicine | Admitting: Emergency Medicine

## 2022-10-07 ENCOUNTER — Emergency Department (HOSPITAL_COMMUNITY): Payer: 59

## 2022-10-07 DIAGNOSIS — K2100 Gastro-esophageal reflux disease with esophagitis, without bleeding: Secondary | ICD-10-CM | POA: Diagnosis not present

## 2022-10-07 DIAGNOSIS — Z794 Long term (current) use of insulin: Secondary | ICD-10-CM | POA: Diagnosis not present

## 2022-10-07 DIAGNOSIS — Z79899 Other long term (current) drug therapy: Secondary | ICD-10-CM | POA: Insufficient documentation

## 2022-10-07 DIAGNOSIS — E119 Type 2 diabetes mellitus without complications: Secondary | ICD-10-CM | POA: Insufficient documentation

## 2022-10-07 DIAGNOSIS — I10 Essential (primary) hypertension: Secondary | ICD-10-CM | POA: Insufficient documentation

## 2022-10-07 DIAGNOSIS — R079 Chest pain, unspecified: Secondary | ICD-10-CM | POA: Diagnosis not present

## 2022-10-07 DIAGNOSIS — F17200 Nicotine dependence, unspecified, uncomplicated: Secondary | ICD-10-CM | POA: Diagnosis not present

## 2022-10-07 DIAGNOSIS — R0789 Other chest pain: Secondary | ICD-10-CM | POA: Diagnosis not present

## 2022-10-07 DIAGNOSIS — I517 Cardiomegaly: Secondary | ICD-10-CM | POA: Diagnosis not present

## 2022-10-07 DIAGNOSIS — Z743 Need for continuous supervision: Secondary | ICD-10-CM | POA: Diagnosis not present

## 2022-10-07 DIAGNOSIS — I499 Cardiac arrhythmia, unspecified: Secondary | ICD-10-CM | POA: Diagnosis not present

## 2022-10-07 DIAGNOSIS — R1013 Epigastric pain: Secondary | ICD-10-CM | POA: Diagnosis not present

## 2022-10-07 LAB — CBC
HCT: 40.4 % (ref 36.0–46.0)
Hemoglobin: 12.9 g/dL (ref 12.0–15.0)
MCH: 26.3 pg (ref 26.0–34.0)
MCHC: 31.9 g/dL (ref 30.0–36.0)
MCV: 82.3 fL (ref 80.0–100.0)
Platelets: 276 10*3/uL (ref 150–400)
RBC: 4.91 MIL/uL (ref 3.87–5.11)
RDW: 14.5 % (ref 11.5–15.5)
WBC: 5.5 10*3/uL (ref 4.0–10.5)
nRBC: 0 % (ref 0.0–0.2)

## 2022-10-07 LAB — HEPATIC FUNCTION PANEL
ALT: 13 U/L (ref 0–44)
AST: 15 U/L (ref 15–41)
Albumin: 3.4 g/dL — ABNORMAL LOW (ref 3.5–5.0)
Alkaline Phosphatase: 96 U/L (ref 38–126)
Bilirubin, Direct: <0.1 mg/dL (ref 0.0–0.2)
Total Bilirubin: 0.6 mg/dL (ref 0.3–1.2)
Total Protein: 7.7 g/dL (ref 6.5–8.1)

## 2022-10-07 LAB — BASIC METABOLIC PANEL
Anion gap: 15 (ref 5–15)
BUN: 20 mg/dL (ref 8–23)
CO2: 18 mmol/L — ABNORMAL LOW (ref 22–32)
Calcium: 10.4 mg/dL — ABNORMAL HIGH (ref 8.9–10.3)
Chloride: 107 mmol/L (ref 98–111)
Creatinine, Ser: 1.26 mg/dL — ABNORMAL HIGH (ref 0.44–1.00)
GFR, Estimated: 48 mL/min — ABNORMAL LOW (ref >60)
Glucose, Bld: 145 mg/dL — ABNORMAL HIGH (ref 70–99)
Potassium: 4 mmol/L (ref 3.5–5.1)
Sodium: 140 mmol/L (ref 135–145)

## 2022-10-07 LAB — LIPASE, BLOOD: Lipase: 32 U/L (ref 11–51)

## 2022-10-07 LAB — TROPONIN I (HIGH SENSITIVITY)
Troponin I (High Sensitivity): 8 ng/L (ref <18)
Troponin I (High Sensitivity): 7 ng/L (ref <18)

## 2022-10-07 LAB — CBG MONITORING, ED: Glucose-Capillary: 134 mg/dL — ABNORMAL HIGH (ref 70–99)

## 2022-10-07 MED ORDER — ONDANSETRON HCL 4 MG/2ML IJ SOLN
4.0000 mg | Freq: Once | INTRAMUSCULAR | Status: AC
  Administered 2022-10-07: 4 mg via INTRAVENOUS
  Filled 2022-10-07: qty 2

## 2022-10-07 MED ORDER — SUCRALFATE 1 G PO TABS: 1.0000 g | ORAL_TABLET | Freq: Three times a day (TID) | ORAL | 0 refills | Status: AC

## 2022-10-07 MED ORDER — ALUM & MAG HYDROXIDE-SIMETH 200-200-20 MG/5ML PO SUSP
30.0000 mL | Freq: Once | ORAL | Status: AC
  Administered 2022-10-07: 30 mL via ORAL
  Filled 2022-10-07: qty 30

## 2022-10-07 MED ORDER — LACTATED RINGERS IV BOLUS
1000.0000 mL | Freq: Once | INTRAVENOUS | Status: AC
  Administered 2022-10-07: 1000 mL via INTRAVENOUS

## 2022-10-07 NOTE — Discharge Instructions (Signed)
You can continue your current medication but avoid any ibuprofen products or Goody's powders.  You can use the medication you were given a prescription for as needed or you could use Tums.  Avoid any foods that have acid such as spaghetti sauce, pineapples, oranges or foods that are very spicy.  Do not eat right before you lay down.  If you start having vomiting, worsening pain, trouble breathing or other concerns return to the emergency room.

## 2022-10-07 NOTE — ED Provider Notes (Signed)
[place] [facility] AT [facility] Provider Note   CSN: [id number] Arrival date & time: 10/07/22  1411     History  Chief Complaint  Patient presents with   Chest Pain    April Mcgee is a 62 y.o. female.  Patient is a 62 year old female with a history of diabetes, hypertension, hyperlipidemia who also smokes tobacco daily but denies drug or alcohol use presenting today with complaints of epigastric pain and chest pain.  She reports having spaghetti yesterday when she laid down at about midnight started having significant discomfort.  She was up all night due to the discomfort that persisted this morning and even made her have an episode of emesis.  She has not really eaten all day and was given 1 nitroglycerin in the ambulance but has been waiting in the waiting room approximately 6 hours and reports overall the pain has improved but she still feeling a bit nauseated.  She does not have any shortness of breath, cough or fever.  She has only had 1 medication change which is increasing her cholesterol medication.  She has not had fever or bad food exposure.  No prior abdominal surgeries.  She does take pantoprazole for reflux.  The history is provided by the patient.  Chest Pain      Home Medications Prior to Admission medications   Medication Sig Start Date End Date Taking? Authorizing Provider  sucralfate (CARAFATE) 1 g tablet Take 1 tablet (1 g total) by mouth 4 (four) times daily -  with meals and at bedtime. Take as needed 10/07/22  Yes Jacoby Ritsema, Alphonzo Lemmings, MD  acetaminophen (TYLENOL) 650 MG CR tablet Take 650 mg by mouth every 8 (eight) hours as needed for pain.    [provider]  amLODipine (NORVASC) 10 MG tablet Take 1 tablet (10 mg total) by mouth daily. 03/19/19   Benjiman Core, MD  cephALEXin (KEFLEX) 500 MG capsule Take 1 capsule (500 mg total) by mouth 4 (four) times daily. 09/25/18   Mesner, Barbara Cower, MD  diclofenac (VOLTAREN) 75 MG EC tablet  Take 75 mg by mouth 2 (two) times daily.    [provider]  gabapentin (NEURONTIN) 600 MG tablet Take 600 mg by mouth 3 (three) times daily.    [provider]  ibuprofen (ADVIL,MOTRIN) 600 MG tablet Take 600 mg by mouth every 12 (twelve) hours as needed for mild pain or moderate pain.    [provider]  insulin aspart protamine- aspart (NOVOLOG MIX 70/30) (70-30) 100 UNIT/ML injection 30 units with breakfast and 50 units with dinner. 03/19/19   Benjiman Core, MD  Melatonin 3 MG TABS Take 1 tablet (3 mg total) by mouth at bedtime. 03/22/19   Hunter, [place], MD  naproxen (NAPROSYN) 250 MG tablet Take 1 tablet (250 mg total) by mouth 2 (two) times daily with a meal. 03/22/19   Hunter, [place], MD  ondansetron (ZOFRAN) 4 MG tablet Take 1 tablet (4 mg total) by mouth every 6 (six) hours. 12/17/11   Nelva Nay, MD  orphenadrine (NORFLEX) 100 MG tablet Take 1 tablet (100 mg total) by mouth 2 (two) times daily. 12/20/15   Dione Booze, MD  pantoprazole (PROTONIX) 40 MG tablet Take 1 tablet (40 mg total) by mouth daily. 03/19/19   Benjiman Core, MD  promethazine (PHENERGAN) 25 MG tablet Take 1 tablet (25 mg total) by mouth every 6 (six) hours as needed for nausea. 02/22/14   Benjiman Core, MD      Allergies  Patient has no known allergies.    Review of Systems   Review of Systems  Cardiovascular:  Positive for chest pain.    Physical Exam Updated Vital Signs BP (!) 141/66   Pulse (!) 50   Temp 98.6 F (37 C) (Oral)   Resp 12   Ht 5' 5.5" (1.664 m)   Wt 68 kg   SpO2 100%   BMI 24.58 kg/m  Physical Exam Vitals and nursing note reviewed.  Constitutional:      General: She is not in acute distress.    Appearance: She is well-developed.  HENT:     Head: Normocephalic and atraumatic.  Eyes:     Conjunctiva/sclera: Conjunctivae normal.     Pupils: Pupils are equal, round, and reactive to light.  Cardiovascular:     Rate and Rhythm: Normal rate and  regular rhythm.     Heart sounds: No murmur heard. Pulmonary:     Effort: Pulmonary effort is normal. No respiratory distress.     Breath sounds: Normal breath sounds. No wheezing or rales.  Abdominal:     General: There is no distension.     Palpations: Abdomen is soft.     Tenderness: There is abdominal tenderness in the right upper quadrant, epigastric area and left upper quadrant. There is no guarding or rebound.  Musculoskeletal:        General: No tenderness. Normal range of motion.     Cervical back: Normal range of motion and neck supple.  Skin:    General: Skin is warm and dry.     Findings: No erythema or rash.  Neurological:     Mental Status: She is alert and oriented to person, place, and time. Mental status is at baseline.  Psychiatric:        Behavior: Behavior normal.     ED Results / Procedures / Treatments   Labs (all labs ordered are listed, but only abnormal results are displayed) Labs Reviewed  BASIC METABOLIC PANEL - Abnormal; Notable for the following components:      Result Value   CO2 18 (*)    Glucose, Bld 145 (*)    Creatinine, Ser 1.26 (*)    Calcium 10.4 (*)    GFR, Estimated 48 (*)    All other components within normal limits  HEPATIC FUNCTION PANEL - Abnormal; Notable for the following components:   Albumin 3.4 (*)    All other components within normal limits  CBG MONITORING, ED - Abnormal; Notable for the following components:   Glucose-Capillary 134 (*)    All other components within normal limits  CBC  LIPASE, BLOOD  TROPONIN I (HIGH SENSITIVITY)  TROPONIN I (HIGH SENSITIVITY)    EKG EKG Interpretation Date/Time:  Thursday October 07 2022 13:56:29 EDT Ventricular Rate:  60 PR Interval:  148 QRS Duration:  72 QT Interval:  406 QTC Calculation: 406 R Axis:   -14  Text Interpretation: Sinus rhythm with marked sinus arrhythmia Septal infarct , age undetermined No significant change since last tracing When compared with ECG of  22-Mar-2019 13:10, PREVIOUS ECG IS PRESENT Confirmed by Gwyneth Sprout ([id number]) on 10/07/2022 8:28:10 PM  Radiology DG Chest 2 View  Result Date: 10/07/2022 CLINICAL DATA:  Chest pain EXAM: CHEST - 2 VIEW COMPARISON:  04/21/2021 FINDINGS: The heart size and mediastinal contours are within normal limits. Cardiomegaly. The visualized skeletal structures are unremarkable. IMPRESSION: Cardiomegaly without acute abnormality of the lungs. Electronically Signed   By: Bonna Gains.D.  On: 10/07/2022 15:24    Procedures Procedures    Medications Ordered in ED Medications  alum & mag hydroxide-simeth (MAALOX/MYLANTA) 200-200-20 MG/5ML suspension 30 mL (30 mLs Oral Given 10/07/22 2109)  lactated ringers bolus 1,000 mL (1,000 mLs Intravenous New Bag/Given 10/07/22 2108)  ondansetron (ZOFRAN) injection 4 mg (4 mg Intravenous Given 10/07/22 2109)    ED Course/ Medical Decision Making/ A&P                                 Medical Decision Making Amount and/or Complexity of Data Reviewed Independent Historian: EMS External Data Reviewed: notes. Labs: ordered. Decision-making details documented in ED Course. Radiology: ordered and independent interpretation performed. Decision-making details documented in ED Course. ECG/medicine tests: ordered and independent interpretation performed. Decision-making details documented in ED Course.  Risk OTC drugs. Prescription drug management.   Pt with multiple medical problems and comorbidities and presenting today with a complaint that caries a high risk for morbidity and mortality.  Here today with complaint of chest and epigastric pain.  Concern for atypical ACS versus cholelithiasis versus gastritis/GERD versus pancreatitis.  Patient does not have abdominal findings concerning for appendicitis or diverticulitis today.  No urinary symptoms to suggest UTI or pyelonephritis.  Low suspicion for PE, pneumonia, pericardial effusion.  Vital signs are reassuring.   I independently interpreted patient's labs and EKG.  Troponin x 2 are negative, LFTs are normal, lipase normal, BMP with improved creatinine from prior now 1.26 from 1.4, CBC within normal limits.  I have independently visualized and interpreted pt's images today.  Chest x-ray without acute findings today.  Findings were discussed with the patient.  Will give antiemetic and GI cocktail and reassess.  Will ensure patient is able to p.o. challenge.  Most likely GI in origin will have patient continue pantoprazole and avoid foods that could trigger symptoms such as spaghetti.          Final Clinical Impression(s) / ED Diagnoses Final diagnoses:  Gastroesophageal reflux disease with esophagitis without hemorrhage    Rx / DC Orders ED Discharge Orders          Ordered    sucralfate (CARAFATE) 1 g tablet  3 times daily with meals & bedtime        10/07/22 2210              Gwyneth Sprout, MD 10/07/22 2215

## 2022-10-07 NOTE — ED Triage Notes (Signed)
EMS BIB patient for chest pain that started yesterday.  Hx of GERD and had ate spaghetti.  SR arrythmia 60-80 BP 176/80  Gave 324mg  ASA and 1 nitroglycerin.    20g Lhand  Pain after nitroglycerin 8 to a 4/10

## 2022-10-07 NOTE — ED Provider Triage Note (Signed)
Emergency Medicine Provider Triage Evaluation Note  April Mcgee , a 62 y.o. female  was evaluated in triage.  Pt complains of chest pain since yesterday after eating spaghetti. Chest pain was constant last night and felt tight. Some mild SOB. Some palpitations. She feels like for the last few days she feels like her HR has been beating fast off and on. HR 60-80. BP 176/80. 324mg  ASA. 1 nitroglycerin with EMS. Ntiro brought her her from 8 to a 4. Sys after nitroglycerin 136 systolic. Does have a h/o GERD on pantoprazole. Reports she has been belching with some improvement. She reports she took insulin without eating.   Review of Systems  Positive:  Negative:   Physical Exam  BP 133/82 (BP Location: Right Arm)   Pulse 70   Temp 98.8 F (37.1 C) (Oral)   Resp 17   Ht 5' 5.5" (1.664 m)   Wt 68 kg   SpO2 99%   BMI 24.58 kg/m  Gen:   Awake, no distress   Resp:  Normal effort  MSK:   Moves extremities without difficulty  Other:  Palpable radial pulses.   Medical Decision Making  Medically screening exam initiated at 2:35 PM.  Appropriate orders placed.  April Mcgee was informed that the remainder of the evaluation will be completed by another provider, this initial triage assessment does not replace that evaluation, and the importance of remaining in the ED until their evaluation is complete.  Chest pain workup. Patient having improvement after nitroglycerin. CBG.   Achille Rich, [place] 10/07/22 1436

## 2022-11-23 DIAGNOSIS — H35351 Cystoid macular degeneration, right eye: Secondary | ICD-10-CM | POA: Diagnosis not present

## 2023-01-28 DIAGNOSIS — J4520 Mild intermittent asthma, uncomplicated: Secondary | ICD-10-CM | POA: Diagnosis not present

## 2023-01-28 DIAGNOSIS — I10 Essential (primary) hypertension: Secondary | ICD-10-CM | POA: Diagnosis not present

## 2023-01-28 DIAGNOSIS — Z23 Encounter for immunization: Secondary | ICD-10-CM | POA: Diagnosis not present

## 2023-01-28 DIAGNOSIS — G629 Polyneuropathy, unspecified: Secondary | ICD-10-CM | POA: Diagnosis not present

## 2023-01-28 DIAGNOSIS — E782 Mixed hyperlipidemia: Secondary | ICD-10-CM | POA: Diagnosis not present

## 2023-01-28 DIAGNOSIS — M129 Arthropathy, unspecified: Secondary | ICD-10-CM | POA: Diagnosis not present

## 2023-01-28 DIAGNOSIS — Z113 Encounter for screening for infections with a predominantly sexual mode of transmission: Secondary | ICD-10-CM | POA: Diagnosis not present

## 2023-01-28 DIAGNOSIS — E1142 Type 2 diabetes mellitus with diabetic polyneuropathy: Secondary | ICD-10-CM | POA: Diagnosis not present

## 2023-01-28 DIAGNOSIS — Z0001 Encounter for general adult medical examination with abnormal findings: Secondary | ICD-10-CM | POA: Diagnosis not present

## 2023-01-28 DIAGNOSIS — Z1211 Encounter for screening for malignant neoplasm of colon: Secondary | ICD-10-CM | POA: Diagnosis not present

## 2023-01-28 DIAGNOSIS — N1832 Chronic kidney disease, stage 3b: Secondary | ICD-10-CM | POA: Diagnosis not present

## 2023-01-28 DIAGNOSIS — Z720 Tobacco use: Secondary | ICD-10-CM | POA: Diagnosis not present

## 2023-02-10 ENCOUNTER — Ambulatory Visit: Payer: 59 | Admitting: Podiatry

## 2023-02-15 ENCOUNTER — Ambulatory Visit: Payer: 59 | Admitting: Podiatry

## 2023-02-23 ENCOUNTER — Ambulatory Visit (INDEPENDENT_AMBULATORY_CARE_PROVIDER_SITE_OTHER): Payer: 59 | Admitting: Podiatry

## 2023-02-23 ENCOUNTER — Encounter: Payer: Self-pay | Admitting: Podiatry

## 2023-02-23 DIAGNOSIS — L84 Corns and callosities: Secondary | ICD-10-CM

## 2023-02-23 DIAGNOSIS — E1142 Type 2 diabetes mellitus with diabetic polyneuropathy: Secondary | ICD-10-CM | POA: Diagnosis not present

## 2023-02-23 DIAGNOSIS — N1832 Chronic kidney disease, stage 3b: Secondary | ICD-10-CM

## 2023-02-23 DIAGNOSIS — Z794 Long term (current) use of insulin: Secondary | ICD-10-CM

## 2023-02-23 DIAGNOSIS — E1122 Type 2 diabetes mellitus with diabetic chronic kidney disease: Secondary | ICD-10-CM

## 2023-02-23 NOTE — Progress Notes (Signed)
  Subjective:  Patient ID: April Mcgee, female    DOB: Aug 18, 1960,   MRN: [id number]  No chief complaint on file.   63 y.o. female presents for concern of thickened elongated and painful nails that are difficult to trim. Requesting to have them trimmed today. Relates burning and tingling in their feet. Patient is diabetic and last A1c was  Lab Results  Component Value Date   HGBA1C 14.1 (H) 11/04/2009   .   PCP:  Harry Lindau, FNP    . Denies any other pedal complaints. Denies n/v/f/c.   Past Medical History:  Diagnosis Date   Diabetes mellitus    Hyperlipidemia    Hypertension     Objective:  Physical Exam: Vascular: DP/PT pulses 2/4 bilateral. CFT <3 seconds. Absent hair growth on digits. Edema noted to bilateral lower extremities. Xerosis noted bilaterally.  Skin. No lacerations or abrasions bilateral feet. Nails 1-5 bilateral  are normal in appearance. Hyperkeratosis noted to plantar lateral foot/heel Musculoskeletal: MMT 5/5 bilateral lower extremities in DF, PF, Inversion and Eversion. Deceased ROM in DF of ankle joint.  Neurological: Sensation intact to light touch. Protective sensation intact bilateral.     Assessment:   1. Type 2 diabetes mellitus with peripheral neuropathy (HCC)   2. Callus of foot   3. Type 2 diabetes mellitus with stage 3b chronic kidney disease, with long-term current use of insulin  (HCC)      Plan:  Patient was evaluated and treated and all questions answered. -Discussed and educated patient on diabetic foot care, especially with  regards to the vascular, neurological and musculoskeletal systems.  -Stressed the importance of good glycemic control and the detriment of not  controlling glucose levels in relation to the foot. -Discussed supportive shoes at all times and checking feet regularly.  -Mechanically debrided hyperkeratotic tissue with chisel without incident as courtesy.   -Answered all patient questions -Patient to return  in 3  months for at risk foot care -Patient advised to call the office if any problems or questions arise in the meantime.   Jennefer Moats, DPM

## 2023-05-20 ENCOUNTER — Encounter (HOSPITAL_COMMUNITY): Payer: Self-pay

## 2023-05-20 ENCOUNTER — Emergency Department (HOSPITAL_COMMUNITY): Payer: MEDICAID

## 2023-05-20 ENCOUNTER — Emergency Department (HOSPITAL_COMMUNITY)
Admission: EM | Admit: 2023-05-20 | Discharge: 2023-05-20 | Disposition: A | Discharge status: Home / Self Care | Payer: MEDICAID | Attending: Emergency Medicine | Admitting: Emergency Medicine

## 2023-05-20 ENCOUNTER — Other Ambulatory Visit: Payer: Self-pay

## 2023-05-20 DIAGNOSIS — K219 Gastro-esophageal reflux disease without esophagitis: Secondary | ICD-10-CM | POA: Diagnosis not present

## 2023-05-20 DIAGNOSIS — Z794 Long term (current) use of insulin: Secondary | ICD-10-CM | POA: Insufficient documentation

## 2023-05-20 DIAGNOSIS — R079 Chest pain, unspecified: Secondary | ICD-10-CM | POA: Diagnosis present

## 2023-05-20 LAB — BASIC METABOLIC PANEL WITH GFR
Anion gap: 9 (ref 5–15)
BUN: 28 mg/dL — ABNORMAL HIGH (ref 8–23)
CO2: 23 mmol/L (ref 22–32)
Calcium: 11.5 mg/dL — ABNORMAL HIGH (ref 8.9–10.3)
Chloride: 105 mmol/L (ref 98–111)
Creatinine, Ser: 1.8 mg/dL — ABNORMAL HIGH (ref 0.44–1.00)
GFR, Estimated: 31 mL/min — ABNORMAL LOW (ref >60)
Glucose, Bld: 243 mg/dL — ABNORMAL HIGH (ref 70–99)
Potassium: 4.2 mmol/L (ref 3.5–5.1)
Sodium: 137 mmol/L (ref 135–145)

## 2023-05-20 LAB — TROPONIN I (HIGH SENSITIVITY)
Troponin I (High Sensitivity): 8 ng/L (ref <18)
Troponin I (High Sensitivity): 7 ng/L (ref <18)

## 2023-05-20 LAB — CBC
HCT: 38.6 % (ref 36.0–46.0)
Hemoglobin: 12.5 g/dL (ref 12.0–15.0)
MCH: 27.1 pg (ref 26.0–34.0)
MCHC: 32.4 g/dL (ref 30.0–36.0)
MCV: 83.7 fL (ref 80.0–100.0)
Platelets: 279 10*3/uL (ref 150–400)
RBC: 4.61 MIL/uL (ref 3.87–5.11)
RDW: 14.8 % (ref 11.5–15.5)
WBC: 5.4 10*3/uL (ref 4.0–10.5)
nRBC: 0 % (ref 0.0–0.2)

## 2023-05-20 MED ORDER — ONDANSETRON 4 MG PO TBDP
4.0000 mg | ORAL_TABLET | Freq: Once | ORAL | Status: AC | PRN
  Administered 2023-05-20: 4 mg via ORAL
  Filled 2023-05-20: qty 1

## 2023-05-20 MED ORDER — PANTOPRAZOLE SODIUM 40 MG PO TBEC: 40.0000 mg | DELAYED_RELEASE_TABLET | Freq: Two times a day (BID) | ORAL | 0 refills | Status: AC

## 2023-05-20 MED ORDER — PANTOPRAZOLE SODIUM 40 MG IV SOLR
40.0000 mg | Freq: Once | INTRAVENOUS | Status: AC
  Administered 2023-05-20: 40 mg via INTRAVENOUS
  Filled 2023-05-20: qty 10

## 2023-05-20 MED ORDER — SODIUM CHLORIDE 0.9 % IV BOLUS
500.0000 mL | Freq: Once | INTRAVENOUS | Status: AC
  Administered 2023-05-20: 500 mL via INTRAVENOUS

## 2023-05-20 MED ORDER — SODIUM CHLORIDE 0.9 % IV BOLUS: 1000.0000 mL | Freq: Once | INTRAVENOUS | Status: DC

## 2023-05-20 MED ORDER — PANTOPRAZOLE SODIUM 40 MG PO TBEC: 40.0000 mg | DELAYED_RELEASE_TABLET | Freq: Two times a day (BID) | ORAL | 0 refills | Status: DC

## 2023-05-20 MED ORDER — LIDOCAINE VISCOUS HCL 2 % MT SOLN
15.0000 mL | Freq: Once | OROMUCOSAL | Status: AC
  Administered 2023-05-20: 15 mL via ORAL
  Filled 2023-05-20: qty 15

## 2023-05-20 MED ORDER — ALUM & MAG HYDROXIDE-SIMETH 200-200-20 MG/5ML PO SUSP
30.0000 mL | Freq: Once | ORAL | Status: AC
  Administered 2023-05-20: 30 mL via ORAL
  Filled 2023-05-20: qty 30

## 2023-05-20 NOTE — Discharge Instructions (Addendum)
 Take the antacid medication as prescribed.  Consider following up with a GI doctor for further evaluation.  Please review the discharge instructions that go over dietary changes and other additional information.  Follow-up with your primary care doctor to have your kidney function test rechecked.

## 2023-05-20 NOTE — ED Notes (Signed)
AVS with prescriptions provided to and discussed with patient. Pt verbalizes understanding of discharge instructions and denies any questions or concerns at this time. Pt has ride home. Pt ambulated out of department independently with steady gait.

## 2023-05-20 NOTE — ED Triage Notes (Signed)
 Pt c/o mid-sternal chest pain radiating down left arm that started 3 days ago. Pt states she has taken indigestion medication w/o relief. Pt has had nausea, vomiting, and shortness of breath.

## 2023-05-20 NOTE — ED Provider Notes (Signed)
 [place] EMERGENCY DEPARTMENT AT [facility] Provider Note   CSN: [id number] Arrival date & time: 05/20/23  1323     History  Chief Complaint  Patient presents with   Chest Pain    April Mcgee is a 63 y.o. female.   Chest Pain    Pt states she is having pain that feels like severe heart burn.  It is burning pain that gets worse with eating.  It also gets worse with bending over.  SHe has had some neasuea and vomiting.  No abd pain or diarrhea.  Patient symptoms started a few days ago.  Patient states it started after eating something.  She has had problems with acid reflux in the past.  She has tried over-the-counter Tums without relief at home  Home Medications Prior to Admission medications   Medication Sig Start Date End Date Taking? Authorizing Provider  pantoprazole (PROTONIX) 40 MG tablet Take 1 tablet (40 mg total) by mouth 2 (two) times daily. 05/20/23  Yes Linwood Dibbles, MD  acetaminophen (TYLENOL) 650 MG CR tablet Take 650 mg by mouth every 8 (eight) hours as needed for pain.    [provider]  amLODipine (NORVASC) 10 MG tablet Take 1 tablet (10 mg total) by mouth daily. 03/19/19   Benjiman Core, MD  cephALEXin (KEFLEX) 500 MG capsule Take 1 capsule (500 mg total) by mouth 4 (four) times daily. 09/25/18   Mesner, Barbara Cower, MD  diclofenac (VOLTAREN) 75 MG EC tablet Take 75 mg by mouth 2 (two) times daily.    [provider]  gabapentin (NEURONTIN) 600 MG tablet Take 600 mg by mouth 3 (three) times daily.    [provider]  ibuprofen (ADVIL,MOTRIN) 600 MG tablet Take 600 mg by mouth every 12 (twelve) hours as needed for mild pain or moderate pain.    [provider]  insulin aspart protamine- aspart (NOVOLOG MIX 70/30) (70-30) 100 UNIT/ML injection 30 units with breakfast and 50 units with dinner. 03/19/19   Benjiman Core, MD  Melatonin 3 MG TABS Take 1 tablet (3 mg total) by mouth at bedtime. 03/22/19   Hunter, [place], MD   naproxen (NAPROSYN) 250 MG tablet Take 1 tablet (250 mg total) by mouth 2 (two) times daily with a meal. 03/22/19   Hunter, [place], MD  ondansetron (ZOFRAN) 4 MG tablet Take 1 tablet (4 mg total) by mouth every 6 (six) hours. 12/17/11   Nelva Nay, MD  orphenadrine (NORFLEX) 100 MG tablet Take 1 tablet (100 mg total) by mouth 2 (two) times daily. 12/20/15   Dione Booze, MD  promethazine (PHENERGAN) 25 MG tablet Take 1 tablet (25 mg total) by mouth every 6 (six) hours as needed for nausea. 02/22/14   Benjiman Core, MD  sucralfate (CARAFATE) 1 g tablet Take 1 tablet (1 g total) by mouth 4 (four) times daily -  with meals and at bedtime. Take as needed 10/07/22   Gwyneth Sprout, MD      Allergies    Patient has no known allergies.    Review of Systems   Review of Systems  Cardiovascular:  Positive for chest pain.    Physical Exam Updated Vital Signs BP (!) 145/64   Pulse 65   Temp 98.4 F (36.9 C) (Oral)   Resp 13   Ht 1.6 m (5\' 3" )   Wt 83.5 kg   SpO2 98%   BMI 32.59 kg/m  Physical Exam Vitals and nursing note reviewed.  Constitutional:  General: She is not in acute distress.    Appearance: She is well-developed.  HENT:     Head: Normocephalic and atraumatic.     Right Ear: External ear normal.     Left Ear: External ear normal.  Eyes:     General: No scleral icterus.       Right eye: No discharge.        Left eye: No discharge.     Conjunctiva/sclera: Conjunctivae normal.  Neck:     Trachea: No tracheal deviation.  Cardiovascular:     Rate and Rhythm: Normal rate and regular rhythm.  Pulmonary:     Effort: Pulmonary effort is normal. No respiratory distress.     Breath sounds: Normal breath sounds. No stridor. No wheezing or rales.  Abdominal:     General: Bowel sounds are normal. There is no distension.     Palpations: Abdomen is soft.     Tenderness: There is no abdominal tenderness. There is no guarding or rebound.  Musculoskeletal:        General:  No tenderness or deformity.     Cervical back: Neck supple.  Skin:    General: Skin is warm and dry.     Findings: No rash.  Neurological:     General: No focal deficit present.     Mental Status: She is alert.     Cranial Nerves: No cranial nerve deficit, dysarthria or facial asymmetry.     Sensory: No sensory deficit.     Motor: No abnormal muscle tone or seizure activity.     Coordination: Coordination normal.  Psychiatric:        Mood and Affect: Mood normal.     ED Results / Procedures / Treatments   Labs (all labs ordered are listed, but only abnormal results are displayed) Labs Reviewed  BASIC METABOLIC PANEL WITH GFR - Abnormal; Notable for the following components:      Result Value   Glucose, Bld 243 (*)    BUN 28 (*)    Creatinine, Ser 1.80 (*)    Calcium 11.5 (*)    GFR, Estimated 31 (*)    All other components within normal limits  CBC  TROPONIN I (HIGH SENSITIVITY)  TROPONIN I (HIGH SENSITIVITY)    EKG None  Radiology DG Chest 2 View Result Date: 05/20/2023 CLINICAL DATA:  Chest pain, left arm and hand numbness EXAM: CHEST - 2 VIEW COMPARISON:  October 07, 2022 FINDINGS: No focal airspace consolidation, pleural effusion, or pneumothorax. No cardiomegaly. No acute fracture or destructive lesion. IMPRESSION: No acute cardiopulmonary abnormality. Electronically Signed   By: Wallie Char M.D.   On: 05/20/2023 15:26    Procedures Procedures    Medications Ordered in ED Medications  ondansetron (ZOFRAN-ODT) disintegrating tablet 4 mg (4 mg Oral Given 05/20/23 1336)  pantoprazole (PROTONIX) injection 40 mg (40 mg Intravenous Given 05/20/23 1639)  alum & mag hydroxide-simeth (MAALOX/MYLANTA) 200-200-20 MG/5ML suspension 30 mL (30 mLs Oral Given 05/20/23 1636)    And  lidocaine (XYLOCAINE) 2 % viscous mouth solution 15 mL (15 mLs Oral Given 05/20/23 1636)  sodium chloride 0.9 % bolus 500 mL (500 mLs Intravenous New Bag/Given 05/20/23 1737)    ED Course/  Medical Decision Making/ A&P Clinical Course as of 05/20/23 1810  Fri May 20, 2023  1731 Delta troponin is normal [JK]  1732 CBC Normal [JK]  1732 Basic metabolic panel(!) Creatinine increased compared to 7 months ago although similar to levels prior to the [JK]  1758 Patient states she is feeling much better after treatment for acid reflux. [JK]    Clinical Course User Index [JK] Linwood Dibbles, MD                                 Medical Decision Making Amount and/or Complexity of Data Reviewed Labs: ordered. Decision-making details documented in ED Course. Radiology: ordered.  Risk OTC drugs. Prescription drug management.   Patient presented to the ER for evaluation of burning type chest pain.  Patient felt like it was severe heartburn.  She tried over-the-counter medications without relief.  Chest x-ray does not show any signs of pneumonia.  No pneumothorax.  No mediastinal mass noted.  Patient's serial troponins are normal.  Low suspicion for ACS at this time.  Patient was treated with antacids with improvement of her symptoms.  Will plan on discharge home with prescriptions for Protonix.  Patient's creatinine noted to be elevated compared to previous values.  This may be related to her nausea vomiting that she experienced she was given IV fluids.  Recommend outpatient follow-up with PCP to have that rechecked        Final Clinical Impression(s) / ED Diagnoses Final diagnoses:  Gastroesophageal reflux disease, unspecified whether esophagitis present    Rx / DC Orders ED Discharge Orders          Ordered    pantoprazole (PROTONIX) 40 MG tablet  2 times daily        05/20/23 1805              Linwood Dibbles, MD 05/20/23 1810

## 2023-09-06 ENCOUNTER — Other Ambulatory Visit: Payer: Self-pay

## 2023-09-06 ENCOUNTER — Encounter (HOSPITAL_COMMUNITY): Payer: Self-pay | Admitting: *Deleted

## 2023-09-06 ENCOUNTER — Ambulatory Visit (HOSPITAL_COMMUNITY)
Admission: EM | Admit: 2023-09-06 | Discharge: 2023-09-06 | Disposition: A | Discharge status: Home / Self Care | Payer: MEDICAID | Attending: Emergency Medicine | Admitting: Emergency Medicine

## 2023-09-06 DIAGNOSIS — I10 Essential (primary) hypertension: Secondary | ICD-10-CM

## 2023-09-06 DIAGNOSIS — M545 Low back pain, unspecified: Secondary | ICD-10-CM | POA: Diagnosis not present

## 2023-09-06 DIAGNOSIS — S161XXA Strain of muscle, fascia and tendon at neck level, initial encounter: Secondary | ICD-10-CM | POA: Diagnosis not present

## 2023-09-06 MED ORDER — ACETAMINOPHEN 500 MG PO TABS: 500.0000 mg | ORAL_TABLET | Freq: Four times a day (QID) | ORAL | 0 refills | Status: AC | PRN

## 2023-09-06 MED ORDER — BACLOFEN 10 MG PO TABS: 10.0000 mg | ORAL_TABLET | Freq: Three times a day (TID) | ORAL | 0 refills | Status: AC
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
## 2023-09-06 NOTE — ED Notes (Signed)
 Unable to triage Pt because she is on her cell with insurance Co.
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
## 2023-09-06 NOTE — Discharge Instructions (Addendum)
 You can take baclofen  every 8 hours as needed for muscle pain and spasms.  Sometimes this can cause drowsiness so do not drive, work, or drink alcohol while taking this Otherwise she can take 500 mg of Tylenol  every 6-8 hours as needed for headache, neck, or back pain. Alternate between ice and heat as needed for pain, and do some gentle stretching to help with pain. You can follow-up with [place] [facility] for further evaluation of your pain continues Be sure to take your blood pressure medication daily as prescribed as this could also be related to your headaches. Follow-up with your primary care provider or return here as needed.

## 2023-09-06 NOTE — ED Notes (Signed)
 Pt continues on cell phone with legal contact.

## 2023-11-08 ENCOUNTER — Other Ambulatory Visit: Payer: Self-pay | Admitting: Internal Medicine

## 2023-11-08 DIAGNOSIS — Z1231 Encounter for screening mammogram for malignant neoplasm of breast: Secondary | ICD-10-CM

## 2023-11-09 ENCOUNTER — Encounter (HOSPITAL_COMMUNITY): Payer: Self-pay | Admitting: Emergency Medicine

## 2023-11-09 ENCOUNTER — Ambulatory Visit (HOSPITAL_COMMUNITY)
Admission: EM | Admit: 2023-11-09 | Discharge: 2023-11-09 | Disposition: A | Discharge status: Home / Self Care | Attending: Physician Assistant | Admitting: Physician Assistant

## 2023-11-09 ENCOUNTER — Ambulatory Visit (INDEPENDENT_AMBULATORY_CARE_PROVIDER_SITE_OTHER)

## 2023-11-09 DIAGNOSIS — I10 Essential (primary) hypertension: Secondary | ICD-10-CM

## 2023-11-09 DIAGNOSIS — M545 Low back pain, unspecified: Secondary | ICD-10-CM

## 2023-11-09 MED ORDER — LIDOCAINE 5 % EX PTCH: 1.0000 | MEDICATED_PATCH | CUTANEOUS | 0 refills | Status: AC

## 2023-11-09 MED ORDER — TRAMADOL HCL 50 MG PO TABS: 50.0000 mg | ORAL_TABLET | Freq: Two times a day (BID) | ORAL | 0 refills | Status: AC | PRN

## 2023-11-09 NOTE — ED Triage Notes (Signed)
 Pt c/o lower back pain x's 2 weeks.  No known injury  Pain worse with movement

## 2023-11-09 NOTE — Discharge Instructions (Signed)
 Your x-ray showed arthritis.  I believe this is contributing to your pain.  Unfortunately, we have to be very careful with the medications we are using because of your past medical history.  Please use the lidocaine  patch for pain relief.  Take tramadol up to twice a day for pain.  This medication is addictive and sedating so try to limit use is much as possible.  Do not combine it with additional medications that make you sleepy including baclofen  or gabapentin.  We cannot provide any refills of this medication.  Follow-up with orthopedics as soon as possible.  Call them to schedule an appointment.  If you have any increasing pain, trouble walking, numbness or tingling in your legs, going to the bathroom on yourself without noticing it you need to be seen immediately.  Your blood pressure is elevated but it did improve after you sat quietly for several minutes.  Continue to monitor this at home.  Avoid decongestants, caffeine, sodium, NSAIDs (aspirin, ibuprofen /Advil , naproxen /Aleve ).  Follow-up with your primary care within a week for recheck.  If you develop any chest pain, shortness of breath, headache, vision change, dizziness in the setting of high blood pressure you need to go to the ER.

## 2023-11-09 NOTE — ED Provider Notes (Signed)
 [facility]    CSN: [id number] Arrival date & time: 11/09/23  1604      History   Chief Complaint Chief Complaint  Patient presents with   Back Pain    HPI April Mcgee is a 63 y.o. female.   Patient presents today with a 2-week history of lower back pain.  She denies any known injury or increased activity prior to symptom onset.  She denies previous injury or surgery involving her back.  She states that pain is rated 8 on a 0-10 pain scale, described as aching/throbbing, worse with going from a sitting to standing position or certain activities, no alleviating factors identified.  She denies any bowel/bladder incontinence, lower extremity weakness, saddle anesthesia.  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  She has tried Tylenol  as well as Epsom salt soaking without improvement in symptoms.  She also started baclofen  which was recently prescribed by her primary care but this has also been ineffective.  She denies any urinary symptoms including frequency, urgency, hematuria.  Denies any fever, nausea, vomiting.  She denies any personal history of malignancy.  She is having difficulty with her daily activities including sleeping at night because of the pain.  Her blood pressure is very elevated.  She denies any chest pain, shortness of breath, headache, vision change, dizziness.  She suspects this is because of pain.  She reports compliance with her antihypertensive therapies.    Past Medical History:  Diagnosis Date   Diabetes mellitus    Hyperlipidemia    Hypertension     Patient Active Problem List   Diagnosis Date Noted   Type 2 diabetes mellitus with stage 3b chronic kidney disease, with long-term current use of insulin  (HCC) 06/10/2022    History reviewed. No pertinent surgical history.  OB History   No obstetric history on file.      Home  Medications    Prior to Admission medications   Medication Sig Start Date End Date Taking? Authorizing Provider  lidocaine  (LIDODERM ) 5 % Place 1 patch onto the skin daily. Remove & Discard patch within 12 hours or as directed by MD 11/09/23  Yes Lucynda Rosano, Rocky K, PA-C  traMADol (ULTRAM) 50 MG tablet Take 1 tablet (50 mg total) by mouth every 12 (twelve) hours as needed for up to 3 days. 11/09/23 11/12/23 Yes Sheran Newstrom, Rocky POUR, PA-C  acetaminophen  (TYLENOL ) 500 MG tablet Take 1 tablet (500 mg total) by mouth every 6 (six) hours as needed. 09/06/23   Johnie Flaming A, NP  amLODipine  (NORVASC ) 10 MG tablet Take 1 tablet (10 mg total) by mouth daily. 03/19/19   Patsey Lot, MD  baclofen  (LIORESAL ) 10 MG tablet Take 1 tablet (10 mg total) by mouth 3 (three) times daily. 09/06/23   Johnie Flaming A, NP  gabapentin (NEURONTIN) 600 MG tablet Take 600 mg by mouth 3 (three) times daily.    [provider]  insulin  aspart protamine- aspart (NOVOLOG  MIX 70/30) (70-30) 100 UNIT/ML injection 30 units with breakfast and 50 units with dinner. 03/19/19   Patsey Lot, MD  Melatonin 3 MG TABS Take 1 tablet (3 mg total) by mouth at bedtime. 03/22/19   Hunter, [place], MD  ondansetron  (ZOFRAN ) 4 MG tablet Take 1 tablet (4 mg total) by mouth every 6 (six) hours. Patient not taking: Reported on 11/09/2023 12/17/11   Lynnea Charleston, MD  pantoprazole  (PROTONIX ) 40 MG tablet  Take 1 tablet (40 mg total) by mouth 2 (two) times daily. 05/20/23   Randol Simmonds, MD  promethazine  (PHENERGAN ) 25 MG tablet Take 1 tablet (25 mg total) by mouth every 6 (six) hours as needed for nausea. Patient not taking: Reported on 11/09/2023 02/22/14   Patsey Lot, MD  sucralfate  (CARAFATE ) 1 g tablet Take 1 tablet (1 g total) by mouth 4 (four) times daily -  with meals and at bedtime. Take as needed 10/07/22   Doretha Folks, MD    Family History No family history on file.  Social History Social History   Tobacco Use   Smoking  status: Every Day    Current packs/day: 0.50    Types: Cigarettes   Smokeless tobacco: Never  Vaping Use   Vaping status: Never Used  Substance Use Topics   Alcohol use: No   Drug use: No     Allergies   Meloxicam   Review of Systems Review of Systems  Constitutional:  Positive for activity change. Negative for appetite change, fatigue and fever.  Eyes:  Negative for visual disturbance.  Respiratory:  Negative for cough and shortness of breath.   Cardiovascular:  Negative for chest pain.  Gastrointestinal:  Negative for abdominal pain, diarrhea, nausea and vomiting.  Genitourinary:  Negative for difficulty urinating, dysuria, enuresis, frequency and urgency.  Musculoskeletal:  Positive for back pain. Negative for arthralgias and myalgias.  Neurological:  Negative for dizziness, syncope, speech difficulty, weakness, light-headedness, numbness and headaches.     Physical Exam Triage Vital Signs ED Triage Vitals  Encounter Vitals Group     BP 11/09/23 1716 (!) 193/93     Girls Systolic BP Percentile --      Girls Diastolic BP Percentile --      Boys Systolic BP Percentile --      Boys Diastolic BP Percentile --      Pulse --      Resp 11/09/23 1716 20     Temp 11/09/23 1716 (!) 97.5 F (36.4 C)     Temp Source 11/09/23 1716 Oral     SpO2 11/09/23 1716 96 %     Weight --      Height --      Head Circumference --      Peak Flow --      Pain Score 11/09/23 1717 8     Pain Loc --      Pain Education --      Exclude from Growth Chart --    No data found.  Updated Vital Signs BP (!) 166/77   Pulse 60   Temp 98.3 F (36.8 C) (Oral)   Resp 20   SpO2 96%   Visual Acuity Right Eye Distance:   Left Eye Distance:   Bilateral Distance:    Right Eye Near:   Left Eye Near:    Bilateral Near:     Physical Exam Vitals reviewed.  Constitutional:      General: She is awake. She is not in acute distress.    Appearance: Normal appearance. She is well-developed. She  is not ill-appearing.     Comments: Very pleasant female appears stated age in no acute distress sitting comfortably in exam room  HENT:     Head: Normocephalic and atraumatic.  Cardiovascular:     Rate and Rhythm: Normal rate and regular rhythm.     Heart sounds: Normal heart sounds, S1 normal and S2 normal. No murmur heard. Pulmonary:     Effort:  Pulmonary effort is normal.     Breath sounds: Normal breath sounds. No wheezing, rhonchi or rales.     Comments: Clear to auscultation bilaterally Abdominal:     Palpations: Abdomen is soft.     Tenderness: There is no abdominal tenderness. There is no right CVA tenderness, left CVA tenderness, guarding or rebound.  Musculoskeletal:     Cervical back: No tenderness or bony tenderness.     Thoracic back: No tenderness or bony tenderness.     Lumbar back: Tenderness and bony tenderness present. Negative right straight leg raise test and negative left straight leg raise test.     Comments: Back: Pain percussion of lumbar vertebrae approximately L3-L5 without deformity or step-off noted.  Strength 5/5 bilateral lower extremities.  Bilateral lumbar paraspinal muscle tenderness without spasm.  No rash noted.  Psychiatric:        Behavior: Behavior is cooperative.      UC Treatments / Results  Labs (all labs ordered are listed, but only abnormal results are displayed) Labs Reviewed - No data to display  EKG   Radiology DG Lumbar Spine Complete Result Date: 11/09/2023 CLINICAL DATA:  Low back pain EXAM: LUMBAR SPINE - COMPLETE 4+ VIEW COMPARISON:  11/12/2020 FINDINGS: Lumbar alignment is within normal limits. Vertebral body heights are maintained. Mild multilevel degenerative osteophyte. Minimal disc space narrowing at L3-L4. Mild lower lumbar facet degenerative changes. IMPRESSION: Mild degenerative changes. Electronically Signed   By: Luke Bun M.D.   On: 11/09/2023 18:35    Procedures Procedures (including critical care  time)  Medications Ordered in UC Medications - No data to display  Initial Impression / Assessment and Plan / UC Course  I have reviewed the triage vital signs and the nursing notes.  Pertinent labs & imaging results that were available during my care of the patient were reviewed by me and considered in my medical decision making (see chart for details).     Patient is well-appearing, afebrile, nontoxic, nontachycardic.  She had significant bony tenderness on exam and so x-ray was obtained that showed degenerative changes without acute osseous abnormality.  Unfortunately, she has a history of chronic kidney disease as well as uncontrolled diabetes and so is not a candidate for systemic steroids or high-dose NSAIDs.  She reports that her symptoms are severe and so I did agree to give her a 3-day course of tramadol (#6, refill 0).  She denies any history of seizures.  We discussed that this medication is addictive and sedating and so she should limit use is much as possible and not combine with additional sedating medications.  Review of [place]  controlled substance database shows no inappropriate refills.  Discussed that this is a short-term solution and then ultimately she will need to see orthopedics for further evaluation and management.  She was given the contact information for local provider with instruction to call to schedule an appointment.  We discussed that if she has any worsening or changing symptoms she needs to be seen immediately.  Her blood pressure was elevated but she denied any signs/symptoms of endorgan damage.  Improved on recheck.  Recommend that she monitor this at home and follow-up with her primary care within a week.  She is to avoid decongestants, caffeine, sodium, NSAIDs.  If she develops any chest pain, shortness of breath, headache, vision change, dizziness in setting of high blood pressure she is to go to the ER.  Final Clinical Impressions(s) / UC Diagnoses    Final diagnoses:  Acute bilateral low back pain without sciatica  Elevated blood pressure reading with diagnosis of hypertension     Discharge Instructions      Your x-ray showed arthritis.  I believe this is contributing to your pain.  Unfortunately, we have to be very careful with the medications we are using because of your past medical history.  Please use the lidocaine  patch for pain relief.  Take tramadol up to twice a day for pain.  This medication is addictive and sedating so try to limit use is much as possible.  Do not combine it with additional medications that make you sleepy including baclofen  or gabapentin.  We cannot provide any refills of this medication.  Follow-up with orthopedics as soon as possible.  Call them to schedule an appointment.  If you have any increasing pain, trouble walking, numbness or tingling in your legs, going to the bathroom on yourself without noticing it you need to be seen immediately.  Your blood pressure is elevated but it did improve after you sat quietly for several minutes.  Continue to monitor this at home.  Avoid decongestants, caffeine, sodium, NSAIDs (aspirin, ibuprofen /Advil , naproxen /Aleve ).  Follow-up with your primary care within a week for recheck.  If you develop any chest pain, shortness of breath, headache, vision change, dizziness in the setting of high blood pressure you need to go to the ER.     ED Prescriptions     Medication Sig Dispense Auth. Provider   lidocaine  (LIDODERM ) 5 % Place 1 patch onto the skin daily. Remove & Discard patch within 12 hours or as directed by MD 30 patch Nyela Cortinas K, PA-C   traMADol (ULTRAM) 50 MG tablet Take 1 tablet (50 mg total) by mouth every 12 (twelve) hours as needed for up to 3 days. 6 tablet Joon Pohle K, PA-C      I have reviewed the PDMP during this encounter.   Sherrell Rocky POUR, PA-C 11/09/23 1912

## 2023-11-25 ENCOUNTER — Ambulatory Visit
Admission: RE | Admit: 2023-11-25 | Discharge: 2023-11-25 | Disposition: A | Source: Ambulatory Visit | Attending: Internal Medicine | Admitting: Internal Medicine

## 2023-11-25 DIAGNOSIS — Z1231 Encounter for screening mammogram for malignant neoplasm of breast: Secondary | ICD-10-CM
